# Patient Record
Sex: Male | Born: 1980 | Race: White | Hispanic: No | Marital: Single | Smoking: Current every day smoker
Health system: Southern US, Community
[De-identification: ages and names within clinical notes are randomized; demographics above are authoritative.]

## PROBLEM LIST (undated history)

## (undated) DIAGNOSIS — I10 Essential (primary) hypertension: Secondary | ICD-10-CM

## (undated) DIAGNOSIS — G459 Transient cerebral ischemic attack, unspecified: Secondary | ICD-10-CM

## (undated) DIAGNOSIS — I639 Cerebral infarction, unspecified: Secondary | ICD-10-CM

## (undated) HISTORY — PX: CORONARY ANGIOPLASTY WITH STENT PLACEMENT: SHX49

---

## 2016-05-06 ENCOUNTER — Encounter (HOSPITAL_COMMUNITY): Payer: Self-pay | Admitting: Emergency Medicine

## 2016-05-06 ENCOUNTER — Emergency Department (HOSPITAL_COMMUNITY): Payer: Medicare PPO

## 2016-05-06 ENCOUNTER — Emergency Department (HOSPITAL_COMMUNITY)
Admission: EM | Admit: 2016-05-06 | Discharge: 2016-05-06 | Disposition: A | Discharge status: Home / Self Care | Payer: Medicare PPO | Attending: Emergency Medicine | Admitting: Emergency Medicine

## 2016-05-06 DIAGNOSIS — M545 Low back pain, unspecified: Secondary | ICD-10-CM

## 2016-05-06 DIAGNOSIS — Z8673 Personal history of transient ischemic attack (TIA), and cerebral infarction without residual deficits: Secondary | ICD-10-CM | POA: Diagnosis not present

## 2016-05-06 DIAGNOSIS — I10 Essential (primary) hypertension: Secondary | ICD-10-CM | POA: Diagnosis not present

## 2016-05-06 DIAGNOSIS — R42 Dizziness and giddiness: Secondary | ICD-10-CM | POA: Diagnosis present

## 2016-05-06 DIAGNOSIS — R109 Unspecified abdominal pain: Secondary | ICD-10-CM | POA: Insufficient documentation

## 2016-05-06 DIAGNOSIS — Z7982 Long term (current) use of aspirin: Secondary | ICD-10-CM | POA: Diagnosis not present

## 2016-05-06 DIAGNOSIS — F17210 Nicotine dependence, cigarettes, uncomplicated: Secondary | ICD-10-CM | POA: Diagnosis not present

## 2016-05-06 HISTORY — DX: Cerebral infarction, unspecified: I63.9

## 2016-05-06 HISTORY — DX: Transient cerebral ischemic attack, unspecified: G45.9

## 2016-05-06 HISTORY — DX: Essential (primary) hypertension: I10

## 2016-05-06 LAB — DIFFERENTIAL
BASOS PCT: 0 %
Basophils Absolute: 0 10*3/uL (ref 0.0–0.1)
EOS PCT: 1 %
Eosinophils Absolute: 0.1 10*3/uL (ref 0.0–0.7)
LYMPHS PCT: 15 %
Lymphs Abs: 1.4 10*3/uL (ref 0.7–4.0)
MONO ABS: 0.4 10*3/uL (ref 0.1–1.0)
MONOS PCT: 5 %
NEUTROS ABS: 7.1 10*3/uL (ref 1.7–7.7)
Neutrophils Relative %: 79 %

## 2016-05-06 LAB — CBC
HEMATOCRIT: 44.5 % (ref 39.0–52.0)
Hemoglobin: 15.3 g/dL (ref 13.0–17.0)
MCH: 29.7 pg (ref 26.0–34.0)
MCHC: 34.4 g/dL (ref 30.0–36.0)
MCV: 86.4 fL (ref 78.0–100.0)
Platelets: 257 10*3/uL (ref 150–400)
RBC: 5.15 MIL/uL (ref 4.22–5.81)
RDW: 12.8 % (ref 11.5–15.5)
WBC: 9 10*3/uL (ref 4.0–10.5)

## 2016-05-06 LAB — URINALYSIS, ROUTINE W REFLEX MICROSCOPIC
BILIRUBIN URINE: NEGATIVE
GLUCOSE, UA: NEGATIVE mg/dL
HGB URINE DIPSTICK: NEGATIVE
Ketones, ur: NEGATIVE mg/dL
Leukocytes, UA: NEGATIVE
Nitrite: NEGATIVE
PROTEIN: NEGATIVE mg/dL
Specific Gravity, Urine: 1.004 — ABNORMAL LOW (ref 1.005–1.030)
pH: 7 (ref 5.0–8.0)

## 2016-05-06 LAB — COMPREHENSIVE METABOLIC PANEL
ALK PHOS: 94 U/L (ref 38–126)
ALT: 20 U/L (ref 17–63)
AST: 31 U/L (ref 15–41)
Albumin: 4.2 g/dL (ref 3.5–5.0)
Anion gap: 9 (ref 5–15)
BUN: 5 mg/dL — ABNORMAL LOW (ref 6–20)
CALCIUM: 9.1 mg/dL (ref 8.9–10.3)
CO2: 23 mmol/L (ref 22–32)
CREATININE: 0.84 mg/dL (ref 0.61–1.24)
Chloride: 107 mmol/L (ref 101–111)
Glucose, Bld: 97 mg/dL (ref 65–99)
Potassium: 4.5 mmol/L (ref 3.5–5.1)
Sodium: 139 mmol/L (ref 135–145)
Total Bilirubin: 1.4 mg/dL — ABNORMAL HIGH (ref 0.3–1.2)
Total Protein: 6.9 g/dL (ref 6.5–8.1)

## 2016-05-06 LAB — I-STAT CHEM 8, ED
BUN: 5 mg/dL — ABNORMAL LOW (ref 6–20)
CALCIUM ION: 1.04 mmol/L — AB (ref 1.15–1.40)
Chloride: 106 mmol/L (ref 101–111)
Creatinine, Ser: 0.8 mg/dL (ref 0.61–1.24)
GLUCOSE: 95 mg/dL (ref 65–99)
HCT: 41 % (ref 39.0–52.0)
HEMOGLOBIN: 13.9 g/dL (ref 13.0–17.0)
Potassium: 4.4 mmol/L (ref 3.5–5.1)
Sodium: 137 mmol/L (ref 135–145)
TCO2: 27 mmol/L (ref 0–100)

## 2016-05-06 LAB — APTT: aPTT: 30 seconds (ref 24–36)

## 2016-05-06 LAB — I-STAT TROPONIN, ED: TROPONIN I, POC: 0.01 ng/mL (ref 0.00–0.08)

## 2016-05-06 LAB — PROTIME-INR
INR: 0.98
Prothrombin Time: 13 seconds (ref 11.4–15.2)

## 2016-05-06 MED ORDER — KETOROLAC TROMETHAMINE 30 MG/ML IJ SOLN
30.0000 mg | Freq: Once | INTRAMUSCULAR | Status: DC
  Filled 2016-05-06: qty 1

## 2016-05-06 MED ORDER — ACETAMINOPHEN 325 MG PO TABS
650.0000 mg | ORAL_TABLET | Freq: Once | ORAL | Status: AC
  Administered 2016-05-06: 650 mg via ORAL
  Filled 2016-05-06: qty 2

## 2016-05-06 NOTE — ED Notes (Signed)
Pt verbalized understanding of d/c instructions and has no further questions. Pt stable and NAD.  

## 2016-05-06 NOTE — ED Notes (Signed)
Both techs in triage, unable to obtain blood on pt. Triage nurse notified.

## 2016-05-06 NOTE — Discharge Instructions (Signed)
Please taper off of your antianxiety medicine as this could likely be causing her dizziness.  If you have any new or worsening signs or symptoms please return to the emergency room for repeat evaluation.  Please follow-up with neurology if you continue to have dizziness despite medication stoppage.  Please read information related to back pain if he have any concerning signs or symptoms please return.  Please use Tylenol or ibuprofen as needed for discomfort

## 2016-05-06 NOTE — ED Triage Notes (Signed)
Pt states he has eben feeling dizzy since yesterday. Neuro is intact in triage. Pt states he has felt Sob at times as well. Pt has had 4 TIAs and a major stroke in 2005. Pt states he has had some palpitations as well. Also pt reports having right flank pain that goes around back. Painful urination. And unable to fully empty at times.

## 2016-05-06 NOTE — ED Provider Notes (Signed)
[facility] Provider Note   CSN: [id number] Arrival date & time: 05/06/16  1041   History   Chief Complaint Chief Complaint  Patient presents with  . Dizziness  . Flank Pain    HPI Luke Bowman is a 36 y.o. male.  HPI   36 year old male presents today with numerous complaints.  [place]  He notes a significant past medical history of stroke and TIA.  Patient notes that for an extended period of time he has had right back and flank pain.  He reports this radiates around to the right upper quadrant.  He notes his symptoms are generally worse in the morning.. Patient notes occasional difficulty with urination.   Patient also notes that he has had dizziness recently.  He notes for the last several months after starting Xanax he has had dizziness, reports this is worse with laying back and certain movements.  Patient reports that he is not taking 0.25 mg 3 times a day as needed for anxiety.  He denies any associated neurological deficits, reports that he does not feel anything like previous TIA or strokes.   Patient denies any fever chills nausea vomiting.    Past Medical History:  Diagnosis Date  . Hypertension   . Stroke (HCC)   . TIA (transient ischemic attack)     There are no active problems to display for this patient.   Past Surgical History:  Procedure Laterality Date  . CORONARY ANGIOPLASTY WITH STENT PLACEMENT         Home Medications    Prior to Admission medications   Medication Sig Start Date End Date Taking? Authorizing Provider  ALPRAZolam (XANAX) 0.25 MG tablet Take 0.25 mg by mouth every 8 (eight) hours. 03/21/16  Yes Historical Provider, MD  aspirin EC 81 MG tablet Take 81 mg by mouth daily. AND TAKES AN ADDITIONAL 81 MG/DAY IF NEEDED FOR HEADACHES   Yes Historical Provider, MD    Family History History reviewed. No pertinent family history.  Social History Social History  Substance Use Topics  .  Smoking status: Current Every Day Smoker    Types: Cigarettes  . Smokeless tobacco: Never Used  . Alcohol use No     Allergies   Toradol [ketorolac tromethamine]; Dilaudid [hydromorphone hcl]; and Metoprolol   Review of Systems Review of Systems  All other systems reviewed and are negative.   Physical Exam Updated Vital Signs BP 128/78   Pulse 67   Temp 98 F (36.7 C) (Oral)   Resp 16   Ht 5\' 11"  (1.803 m)   Wt 83 kg   SpO2 99%   BMI 25.52 kg/m   Physical Exam  Constitutional: He is oriented to person, place, and time. He appears well-developed and well-nourished.  HENT:  Head: Normocephalic and atraumatic.  Eyes: Conjunctivae are normal. Pupils are equal, round, and reactive to light. Right eye exhibits no discharge. Left eye exhibits no discharge. No scleral icterus.  Neck: Normal range of motion. No JVD present. No tracheal deviation present.  Pulmonary/Chest: Effort normal. No stridor.  Abdominal: Soft. He exhibits no distension and no mass. There is no tenderness. There is no rebound and no guarding. No hernia.  Musculoskeletal:  Tenderness to palpation of the right lower lumbar soft tissue and flank, no signs of trauma or swelling or edema  Neurological: He is alert and oriented to person, place, and time. He has normal strength. No cranial nerve deficit or sensory deficit. Coordination normal. GCS  eye subscore is 4. GCS verbal subscore is 5. GCS motor subscore is 6.  Psychiatric: He has a normal mood and affect. His behavior is normal. Judgment and thought content normal.  Nursing note and vitals reviewed.    ED Treatments / Results  Labs (all labs ordered are listed, but only abnormal results are displayed) Labs Reviewed  URINALYSIS, ROUTINE W REFLEX MICROSCOPIC - Abnormal; Notable for the following:       Result Value   Color, Urine STRAW (*)    Specific Gravity, Urine 1.004 (*)    All other components within normal limits  COMPREHENSIVE METABOLIC PANEL  - Abnormal; Notable for the following:    BUN 5 (*)    Total Bilirubin 1.4 (*)    All other components within normal limits  I-STAT CHEM 8, ED - Abnormal; Notable for the following:    BUN 5 (*)    Calcium, Ion 1.04 (*)    All other components within normal limits  PROTIME-INR  APTT  CBC  DIFFERENTIAL  I-STAT TROPOININ, ED  CBG MONITORING, ED    EKG  EKG Interpretation  Date/Time:  Friday May 06 2016 11:02:52 EST Ventricular Rate:  100 PR Interval:  124 QRS Duration: 94 QT Interval:  344 QTC Calculation: 443 R Axis:   76 Text Interpretation:  Normal sinus rhythm Normal ECG Confirmed by Luke Busman  MD, ANTHONY ([id number]) on 05/06/2016 3:28:55 PM       Radiology Ct Head Wo Contrast  Result Date: 05/06/2016 CLINICAL DATA:  Headache. EXAM: CT HEAD WITHOUT CONTRAST TECHNIQUE: Contiguous axial images were obtained from the base of the skull through the vertex without intravenous contrast. COMPARISON:  None. FINDINGS: Brain: No evidence of acute infarction, hemorrhage, hydrocephalus, extra-axial collection or mass lesion/mass effect. Vascular: No hyperdense vessel or unexpected calcification. Skull: Normal. Negative for fracture or focal lesion. Sinuses/Orbits: No acute finding. Other: None. IMPRESSION: Normal head CT. Electronically Signed   By: Lupita Raider, M.D.   On: 05/06/2016 11:42   Ct Renal Stone Study  Result Date: 05/06/2016 CLINICAL DATA:  Right flank pain.  Pain with urination. EXAM: CT ABDOMEN AND PELVIS WITHOUT CONTRAST TECHNIQUE: Multidetector CT imaging of the abdomen and pelvis was performed following the standard protocol without IV contrast. COMPARISON:  None. FINDINGS: Lower chest: Mild dependent atelectasis is present at the lung bases bilaterally. No focal nodule, mass, or airspace disease is present. The heart size is normal. No significant pleural or pericardial effusion is present. Hepatobiliary: No focal hepatic lesions are present. Liver contour is within normal  limits. The common bile duct is within normal limits following cholecystectomy. Pancreas: Unremarkable. No pancreatic ductal dilatation or surrounding inflammatory changes. Spleen: Normal in size without focal abnormality. Adrenals/Urinary Tract: Adrenal glands are unremarkable. Kidneys are normal, without renal calculi, focal lesion, or hydronephrosis. Bladder is unremarkable. Stomach/Bowel: Stomach is within normal limits. Appendix appears normal. No evidence of bowel wall thickening, distention, or inflammatory changes. Vascular/Lymphatic: No significant vascular findings are present. No enlarged abdominal or pelvic lymph nodes. Reproductive: Prostate is unremarkable. Other: No abdominal wall hernia or abnormality. No abdominopelvic ascites. Musculoskeletal: Vertebral body heights and alignment are normal. No focal lytic or blastic lesions are present. Bony pelvis is intact. The hips are unremarkable. IMPRESSION: 1. No acute or focal lesion to explain the patient's right-sided flank and back pain. 2. Negative CT of the abdomen without contrast the Electronically Signed   By: Marin Roberts M.D.   On: 05/06/2016 20:05    Procedures  Procedures (including critical care time)  Medications Ordered in ED Medications  acetaminophen (TYLENOL) tablet 650 mg (650 mg Oral Given 05/06/16 1900)     Initial Impression / Assessment and Plan / ED Course  I have reviewed the triage vital signs and the nursing notes.  Pertinent labs & imaging results that were available during my care of the patient were reviewed by me and considered in my medical decision making (see chart for details).     Final Clinical Impressions(s) / ED Diagnoses   Final diagnoses:  Acute right-sided low back pain without sciatica  Dizziness    Labs: Urinalysis, i-STAT Chem-8, serum troponin, PT/INR, APTT  Imaging:  Consults:  Therapeutics:  Discharge Meds:   Assessment/Plan: 36 year old male presents today with vague  complaints.  Patient having back pain, concern for kidney stones.  CT shows no significant findings.  Patient is very well appearing.  Likely back pain.  Patient also complaining of dizziness since starting new antianxiety medicine, unlikely to be a central cause including stroke.  Less likely but possible to be peripheral vertigo, no significant symptoms here.  CT head was ordered prior to my evaluation.  Patient will be instructed to slowly titrate off medication, follow up with neurology if continues to have episodic dizziness.  Patient is given strict return precautions, he verbalized understanding and agreement to today's plan had no further questions or concerns.   New Prescriptions Discharge Medication List as of 05/06/2016  8:48 PM       Eyvonne Mechanic, PA-C 05/06/16 2125    Lorre Nick, MD 05/07/16 0730

## 2016-05-06 NOTE — ED Notes (Signed)
Patient transported to CT 

## 2016-05-12 ENCOUNTER — Ambulatory Visit: Payer: Self-pay | Admitting: Family Medicine

## 2016-05-23 ENCOUNTER — Emergency Department (HOSPITAL_COMMUNITY)
Admission: EM | Admit: 2016-05-23 | Discharge: 2016-05-24 | Disposition: A | Discharge status: Home / Self Care | Payer: Medicare PPO | Attending: Emergency Medicine | Admitting: Emergency Medicine

## 2016-05-23 ENCOUNTER — Encounter (HOSPITAL_COMMUNITY): Payer: Self-pay | Admitting: Emergency Medicine

## 2016-05-23 ENCOUNTER — Emergency Department (HOSPITAL_COMMUNITY): Payer: Medicare PPO

## 2016-05-23 DIAGNOSIS — Z955 Presence of coronary angioplasty implant and graft: Secondary | ICD-10-CM | POA: Insufficient documentation

## 2016-05-23 DIAGNOSIS — I10 Essential (primary) hypertension: Secondary | ICD-10-CM | POA: Diagnosis not present

## 2016-05-23 DIAGNOSIS — Z8673 Personal history of transient ischemic attack (TIA), and cerebral infarction without residual deficits: Secondary | ICD-10-CM | POA: Diagnosis not present

## 2016-05-23 DIAGNOSIS — R1032 Left lower quadrant pain: Secondary | ICD-10-CM | POA: Insufficient documentation

## 2016-05-23 DIAGNOSIS — Z79899 Other long term (current) drug therapy: Secondary | ICD-10-CM | POA: Diagnosis not present

## 2016-05-23 DIAGNOSIS — Z7982 Long term (current) use of aspirin: Secondary | ICD-10-CM | POA: Insufficient documentation

## 2016-05-23 DIAGNOSIS — R109 Unspecified abdominal pain: Secondary | ICD-10-CM | POA: Diagnosis present

## 2016-05-23 DIAGNOSIS — F17210 Nicotine dependence, cigarettes, uncomplicated: Secondary | ICD-10-CM | POA: Insufficient documentation

## 2016-05-23 LAB — LIPASE, BLOOD: Lipase: 24 U/L (ref 11–51)

## 2016-05-23 LAB — CBC
HCT: 44.4 % (ref 39.0–52.0)
Hemoglobin: 15.1 g/dL (ref 13.0–17.0)
MCH: 29.3 pg (ref 26.0–34.0)
MCHC: 34 g/dL (ref 30.0–36.0)
MCV: 86.2 fL (ref 78.0–100.0)
PLATELETS: 260 10*3/uL (ref 150–400)
RBC: 5.15 MIL/uL (ref 4.22–5.81)
RDW: 12.7 % (ref 11.5–15.5)
WBC: 8.7 10*3/uL (ref 4.0–10.5)

## 2016-05-23 LAB — URINALYSIS, ROUTINE W REFLEX MICROSCOPIC
Bacteria, UA: NONE SEEN
Bilirubin Urine: NEGATIVE
GLUCOSE, UA: NEGATIVE mg/dL
Hgb urine dipstick: NEGATIVE
KETONES UR: NEGATIVE mg/dL
Nitrite: NEGATIVE
PROTEIN: NEGATIVE mg/dL
Specific Gravity, Urine: 1.004 — ABNORMAL LOW (ref 1.005–1.030)
Squamous Epithelial / LPF: NONE SEEN
WBC, UA: NONE SEEN WBC/hpf (ref 0–5)
pH: 7 (ref 5.0–8.0)

## 2016-05-23 LAB — COMPREHENSIVE METABOLIC PANEL
ALK PHOS: 96 U/L (ref 38–126)
ALT: 36 U/L (ref 17–63)
AST: 24 U/L (ref 15–41)
Albumin: 4.5 g/dL (ref 3.5–5.0)
Anion gap: 8 (ref 5–15)
BUN: 12 mg/dL (ref 6–20)
CALCIUM: 9.1 mg/dL (ref 8.9–10.3)
CO2: 27 mmol/L (ref 22–32)
CREATININE: 0.91 mg/dL (ref 0.61–1.24)
Chloride: 104 mmol/L (ref 101–111)
Glucose, Bld: 100 mg/dL — ABNORMAL HIGH (ref 65–99)
Potassium: 3.6 mmol/L (ref 3.5–5.1)
Sodium: 139 mmol/L (ref 135–145)
TOTAL PROTEIN: 7.2 g/dL (ref 6.5–8.1)
Total Bilirubin: 0.3 mg/dL (ref 0.3–1.2)

## 2016-05-23 NOTE — ED Triage Notes (Signed)
Pt reports LLQ pain and diarrhea x1 hour, pt denies any urinary symptoms. Ambulatory into triage, nad.

## 2016-05-23 NOTE — ED Provider Notes (Signed)
[facility] Provider Note   CSN: [id number] Arrival date & time: 05/23/16  1747     History   Chief Complaint Chief Complaint  Patient presents with  . Abdominal Pain    HPI Luke Bowman is a 36 y.o. male.  He complains of a hot feeling in the right side of his abdomen today. He rates the pain at 8/10. There is associated diarrhea. He denies nausea or vomiting. He denies fever or chills. Nothing makes it better nothing makes it worse. He states that at one point, he did break out in a rash which resolved spontaneously. He has also been having a hot feeling in his back intermittently over the last 2-3 weeks. He relates all of this to his PCP changing his medication from Ativan to Xanax. He is status post cholecystectomy.   The history is provided by the patient.  Abdominal Pain      Past Medical History:  Diagnosis Date  . Hypertension   . Stroke (HCC)   . TIA (transient ischemic attack)     There are no active problems to display for this patient.   Past Surgical History:  Procedure Laterality Date  . CORONARY ANGIOPLASTY WITH STENT PLACEMENT         Home Medications    Prior to Admission medications   Medication Sig Start Date End Date Taking? Authorizing Provider  ALPRAZolam (XANAX) 0.25 MG tablet Take 0.25 mg by mouth every 8 (eight) hours. 03/21/16   Historical Provider, MD  aspirin EC 81 MG tablet Take 81 mg by mouth daily. AND TAKES AN ADDITIONAL 81 MG/DAY IF NEEDED FOR HEADACHES    Historical Provider, MD    Family History No family history on file.  Social History Social History  Substance Use Topics  . Smoking status: Current Every Day Smoker    Types: Cigarettes  . Smokeless tobacco: Never Used  . Alcohol use No     Allergies   Toradol [ketorolac tromethamine]; Dilaudid [hydromorphone hcl]; and Metoprolol   Review of Systems Review of Systems  Gastrointestinal: Positive for abdominal pain.  All other systems reviewed and are  negative.    Physical Exam Updated Vital Signs BP (!) 142/95 (BP Location: Right Arm)   Pulse 100   Temp 97.7 F (36.5 C) (Oral)   Resp 16   SpO2 99%   Physical Exam  Nursing note and vitals reviewed.  36 year old male, resting comfortably and in no acute distress. Vital signs are significant for hypertension. Oxygen saturation is 99%, which is normal. Head is normocephalic and atraumatic. PERRLA, EOMI. Oropharynx is clear. Neck is nontender and supple without adenopathy or JVD. Back is nontender and there is no CVA tenderness. Lungs are clear without rales, wheezes, or rhonchi. Chest is nontender. Heart has regular rate and rhythm without murmur. Abdomen is soft, flat, with moderate right-sided abdominal tenderness. Maximum tenderness is in the right upper quadrant. There is no rebound or guarding. There are no masses or hepatosplenomegaly and peristalsis is normoactive. Extremities have no cyanosis or edema, full range of motion is present. Skin is warm and dry without rash. Neurologic: Mental status is normal, cranial nerves are intact, there are no motor or sensory deficits.  ED Treatments / Results  Labs (all labs ordered are listed, but only abnormal results are displayed) Labs Reviewed  COMPREHENSIVE METABOLIC PANEL - Abnormal; Notable for the following:       Result Value   Glucose, Bld 100 (*)    All  other components within normal limits  URINALYSIS, ROUTINE W REFLEX MICROSCOPIC - Abnormal; Notable for the following:    Color, Urine STRAW (*)    Specific Gravity, Urine 1.004 (*)    Leukocytes, UA TRACE (*)    All other components within normal limits  LIPASE, BLOOD  CBC    Radiology [place] Abdomen Complete  Result Date: 05/23/2016 CLINICAL DATA:  Initial evaluation for acute right-sided abdominal pain. EXAM: ABDOMEN ULTRASOUND COMPLETE COMPARISON:  Prior CT from 05/06/2016. FINDINGS: Gallbladder: Surgically absent. Common bile duct: Diameter: 4 mm Liver: No focal  lesion identified. Within normal limits in parenchymal echogenicity. IVC: No abnormality visualized. Pancreas: Visualized portion unremarkable. Spleen: Size and appearance within normal limits. Right Kidney: Length: 12.1 cm. Echogenicity within normal limits. No solid mass or hydronephrosis visualized. 0.9 x 1.2 x 1.1 cm cyst noted. Left Kidney: Length: 11.5 cm. Echogenicity within normal limits. No mass or hydronephrosis visualized. Abdominal aorta: No aneurysm visualized. Other findings: None. IMPRESSION: 1. Negative abdominal ultrasound.  No acute abnormality identified. 2. Status post cholecystectomy. 3. 1.1 cm right renal cyst. Electronically Signed   By: Rise MuBenjamin  McClintock M.D.   On: 05/23/2016 23:51    Procedures Procedures (including critical care time)  Medications Ordered in ED Medications  oxyCODONE-acetaminophen (PERCOCET/ROXICET) 5-325 MG per tablet 1 tablet (not administered)  LORazepam (ATIVAN) tablet 0.5 mg (not administered)     Initial Impression / Assessment and Plan / ED Course  I have reviewed the triage vital signs and the nursing notes.  Pertinent labs & imaging results that were available during my care of the patient were reviewed by me and considered in my medical decision making (see chart for details).  Abdominal pain of uncertain cause. His description is rather peculiar. Laboratory workup is unremarkable. Review of old records shows a negative CT of his abdomen and pelvis done on March 2. We'll check ultrasound to make sure there is no evidence of choledocholithiasis, although, that would be unlikely in face of normal liver function tests. If workup is negative, will try switching him back to lorazepam from alprazolam to see if that does help his symptoms.  Ultrasound is unremarkable. He is given a prescription for 10 day supply of lorazepam and is referred back to [facility]. Return precautions  discussed.  Final Clinical Impressions(s) / ED Diagnoses   Final diagnoses:  Right sided abdominal pain    New Prescriptions New Prescriptions   LORAZEPAM (ATIVAN) 0.5 MG TABLET    Take 1 tablet (0.5 mg total) by mouth 3 (three) times daily as needed for anxiety.     Dione Boozeavid Lis Savitt, MD 05/24/16 [phone]

## 2016-05-24 DIAGNOSIS — R1032 Left lower quadrant pain: Secondary | ICD-10-CM | POA: Diagnosis not present

## 2016-05-24 MED ORDER — LORAZEPAM 0.5 MG PO TABS: 0.5000 mg | ORAL_TABLET | Freq: Three times a day (TID) | ORAL | 0 refills | Status: DC | PRN

## 2016-05-24 MED ORDER — ACETAMINOPHEN 325 MG PO TABS
650.0000 mg | ORAL_TABLET | Freq: Once | ORAL | Status: AC
  Administered 2016-05-24: 650 mg via ORAL
  Filled 2016-05-24: qty 2

## 2016-05-24 MED ORDER — OXYCODONE-ACETAMINOPHEN 5-325 MG PO TABS
1.0000 | ORAL_TABLET | Freq: Once | ORAL | Status: DC
  Filled 2016-05-24: qty 1

## 2016-05-24 MED ORDER — LORAZEPAM 0.5 MG PO TABS
0.5000 mg | ORAL_TABLET | Freq: Once | ORAL | Status: AC
Start: 2016-05-24 — End: 2016-05-24
  Administered 2016-05-24: 0.5 mg via ORAL
  Filled 2016-05-24: qty 1

## 2016-05-24 NOTE — Discharge Instructions (Signed)
Stop taking alprazolam (Xanax). Start taking lorazepam (Ativan). Return to the ED if your pain is getting worse.

## 2016-05-27 ENCOUNTER — Encounter (HOSPITAL_COMMUNITY): Payer: Self-pay | Admitting: *Deleted

## 2016-05-27 ENCOUNTER — Emergency Department (HOSPITAL_COMMUNITY): Payer: Medicare PPO

## 2016-05-27 ENCOUNTER — Emergency Department (HOSPITAL_COMMUNITY)
Admission: EM | Admit: 2016-05-27 | Discharge: 2016-05-28 | Disposition: A | Discharge status: Home / Self Care | Payer: Medicare PPO | Attending: Emergency Medicine | Admitting: Emergency Medicine

## 2016-05-27 DIAGNOSIS — R51 Headache: Secondary | ICD-10-CM | POA: Diagnosis present

## 2016-05-27 DIAGNOSIS — G44209 Tension-type headache, unspecified, not intractable: Secondary | ICD-10-CM | POA: Diagnosis not present

## 2016-05-27 DIAGNOSIS — R1011 Right upper quadrant pain: Secondary | ICD-10-CM | POA: Diagnosis not present

## 2016-05-27 DIAGNOSIS — Z8673 Personal history of transient ischemic attack (TIA), and cerebral infarction without residual deficits: Secondary | ICD-10-CM | POA: Diagnosis not present

## 2016-05-27 DIAGNOSIS — F17210 Nicotine dependence, cigarettes, uncomplicated: Secondary | ICD-10-CM | POA: Diagnosis not present

## 2016-05-27 DIAGNOSIS — Z7982 Long term (current) use of aspirin: Secondary | ICD-10-CM | POA: Insufficient documentation

## 2016-05-27 DIAGNOSIS — I10 Essential (primary) hypertension: Secondary | ICD-10-CM | POA: Diagnosis not present

## 2016-05-27 DIAGNOSIS — Z955 Presence of coronary angioplasty implant and graft: Secondary | ICD-10-CM | POA: Insufficient documentation

## 2016-05-27 LAB — CBC
HEMATOCRIT: 44.1 % (ref 39.0–52.0)
Hemoglobin: 15.1 g/dL (ref 13.0–17.0)
MCH: 29.5 pg (ref 26.0–34.0)
MCHC: 34.2 g/dL (ref 30.0–36.0)
MCV: 86.1 fL (ref 78.0–100.0)
Platelets: 265 10*3/uL (ref 150–400)
RBC: 5.12 MIL/uL (ref 4.22–5.81)
RDW: 12.6 % (ref 11.5–15.5)
WBC: 7.6 10*3/uL (ref 4.0–10.5)

## 2016-05-27 LAB — BASIC METABOLIC PANEL
Anion gap: 9 (ref 5–15)
BUN: 7 mg/dL (ref 6–20)
CALCIUM: 9.4 mg/dL (ref 8.9–10.3)
CO2: 28 mmol/L (ref 22–32)
Chloride: 104 mmol/L (ref 101–111)
Creatinine, Ser: 0.85 mg/dL (ref 0.61–1.24)
GFR calc Af Amer: >60 mL/min (ref >60)
GLUCOSE: 88 mg/dL (ref 65–99)
Potassium: 4 mmol/L (ref 3.5–5.1)
Sodium: 141 mmol/L (ref 135–145)

## 2016-05-27 LAB — TROPONIN I: Troponin I: <0.03 ng/mL (ref <0.03)

## 2016-05-27 NOTE — ED Provider Notes (Signed)
[facility] Provider Note   CSN: [id number] Arrival date & time: 05/27/16  1952   By signing my name below, I, Octavia Heirrianna Nassar, attest that this documentation has been prepared under the direction and in the presence of Azalia BilisKevin Lenice Koper, MD.  Electronically Signed: Octavia HeirArianna Nassar, ED Scribe. 05/27/16. 11:38 PM.   History   Chief Complaint Chief Complaint  Patient presents with  . Headache   The history is provided by the patient. No language interpreter was used.   HPI Comments: Luke Bowman is a 36 y.o. male who has a PMhx of HTN and CVA presents to the Emergency Department complaining of a moderate, unchanged headache  that began ~ 4:30 this evening (~ 7 hours ago). He was sitting down watching television when he began to have acute onset left sided weakness, followed by a headache and numbness that radiated from the left side of his head down to his left lower extremity. He reports that his numbness is moderately resolved but still has mild residual numbness in his left foot. Pt also reports he has been having intermittent fever, chills, and diarrhea x 4 days. He states he has been around exposed to mold for quite some time and expresses his concern. Pt currently takes one ASA daily and is on Plavix. Pt further expresses intermittent, right sided abdominal pain x 4 days. Pt describes his abdominal pain as a "warm" sensation and expresses that he has a "metallic" taste in his mouth. He was seen in the ED on 03/19 for the same abdominal pain and had imaging including a CT Abd/Pel as well as lab work performed. All imaging and labs came back unremarkable. He was prescribed a 10 day supply of Lorazepam and was told to follow up with his PCP. Pt states he has been having intermittent abdominal pain since October 2017 after having a [place] in [place].  Positive fever, chills, diarrhea x 5 days ago. Pt denies vomiting. Past Medical History:  Diagnosis Date  . Hypertension    . Stroke (HCC)   . TIA (transient ischemic attack)     There are no active problems to display for this patient.   Past Surgical History:  Procedure Laterality Date  . CORONARY ANGIOPLASTY WITH STENT PLACEMENT         Home Medications    Prior to Admission medications   Medication Sig Start Date End Date Taking? Authorizing Provider  aspirin EC 81 MG tablet Take 81 mg by mouth daily. AND TAKES AN ADDITIONAL 81 MG/DAY IF NEEDED FOR HEADACHES    Historical Provider, MD  LORazepam (ATIVAN) 0.5 MG tablet Take 1 tablet (0.5 mg total) by mouth 3 (three) times daily as needed for anxiety. 05/24/16   Dione Boozeavid Glick, MD    Family History No family history on file.  Social History Social History  Substance Use Topics  . Smoking status: Current Every Day Smoker    Types: Cigarettes  . Smokeless tobacco: Never Used  . Alcohol use No     Allergies   Toradol [ketorolac tromethamine]; Dilaudid [hydromorphone hcl]; and Metoprolol   Review of Systems Review of Systems  A complete 10 system review of systems was obtained and all systems are negative except as noted in the HPI and PMH.   Physical Exam Updated Vital Signs BP 119/78 (BP Location: Right Arm)   Pulse 84   Temp 98 F (36.7 C) (Oral)   Resp 16   SpO2 100%   Physical Exam  Constitutional: He is  oriented to person, place, and time. He appears well-developed and well-nourished.  HENT:  Head: Normocephalic and atraumatic.  Eyes: EOM are normal. Pupils are equal, round, and reactive to light.  Neck: Normal range of motion.  Cardiovascular: Regular rhythm.   Pulmonary/Chest: Effort normal.  Abdominal: Soft. He exhibits no distension.  Musculoskeletal: Normal range of motion.  Neurological: He is alert and oriented to person, place, and time.  5/5 strength in major muscle groups of  bilateral upper and lower extremities. Speech normal. No facial asymetry.   Psychiatric: He has a normal mood and affect.  Nursing note  and vitals reviewed.    ED Treatments / Results  DIAGNOSTIC STUDIES: Oxygen Saturation is 100% on RA, normal by my interpretation.  COORDINATION OF CARE:  11:37 PM Discussed treatment plan with pt at bedside and pt agreed to plan.  Labs (all labs ordered are listed, but only abnormal results are displayed) Labs Reviewed  BASIC METABOLIC PANEL  CBC  TROPONIN I    EKG  EKG Interpretation None       Radiology Dg Chest 2 View  Result Date: 05/27/2016 CLINICAL DATA:  36 year old with headache and tingling in the arms and legs. Patient reports remote history of strokes. EXAM: CHEST  2 VIEW COMPARISON:  Abdominal CT 05/06/2016. FINDINGS: The heart size and mediastinal contours are normal. Increased density at the left cardiophrenic angle corresponds with epicardial fat on recent abdominal CT. The lungs are clear. There is no pleural effusion or pneumothorax. No acute osseous findings are seen. IMPRESSION: No active cardiopulmonary process. Electronically Signed   By: Carey Bullocks M.D.   On: 05/27/2016 21:07   Ct Head Wo Contrast  Result Date: 05/27/2016 CLINICAL DATA:  Acute onset of headache and left-sided weakness. Initial encounter. EXAM: CT HEAD WITHOUT CONTRAST TECHNIQUE: Contiguous axial images were obtained from the base of the skull through the vertex without intravenous contrast. COMPARISON:  CT of the head performed 05/06/2016 FINDINGS: Brain: No evidence of acute infarction, hemorrhage, hydrocephalus, extra-axial collection or mass lesion/mass effect. The posterior fossa, including the cerebellum, brainstem and fourth ventricle, is within normal limits. The third and lateral ventricles, and basal ganglia are unremarkable in appearance. The cerebral hemispheres are symmetric in appearance, with normal gray-white differentiation. No mass effect or midline shift is seen. Vascular: No hyperdense vessel or unexpected calcification. Skull: There is no evidence of fracture;  visualized osseous structures are unremarkable in appearance. Sinuses/Orbits: The orbits are within normal limits. The paranasal sinuses and mastoid air cells are well-aerated. Other: No significant soft tissue abnormalities are seen. IMPRESSION: Unremarkable noncontrast CT of the head. Electronically Signed   By: Roanna Raider M.D.   On: 05/27/2016 22:55    Procedures Procedures (including critical care time)  Medications Ordered in ED Medications - No data to display   Initial Impression / Assessment and Plan / ED Course  I have reviewed the triage vital signs and the nursing notes.  Pertinent labs & imaging results that were available during my care of the patient were reviewed by me and considered in my medical decision making (see chart for details).     Normal neuro exam. No tenderness. Paresthesias. Likely complicated migraine. Continues with ASA and plavix at this time for hx of stroke and TIA. Recurrent intermittent abdominal pain. No abdominal tenderness at this time. Likely IBS. Pt is fixated on the fact this is related to mold. Unclear if this is true or not. Outpatient follow up. Will need referral to GI  and pt requesting follow up with ID  Final Clinical Impressions(s) / ED Diagnoses   Final diagnoses:  Acute non intractable tension-type headache  Right upper quadrant abdominal pain   I personally performed the services described in this documentation, which was scribed in my presence. The recorded information has been reviewed and is accurate.     New Prescriptions New Prescriptions   No medications on file     Azalia Bilis, MD 05/28/16 0001

## 2016-05-27 NOTE — ED Notes (Signed)
Dr. Campos at bedside at this time.  

## 2016-05-27 NOTE — ED Triage Notes (Signed)
The pt is c/o a headache for 1-2 weeks and he is also c/o weakness in his lt arm and both legs since 1700 today  Speech is clear he was her last week for other cioncerns  And he has had some chest discomfort

## 2016-05-28 NOTE — Discharge Instructions (Signed)
May benefit from follow up with [facility]

## 2016-05-29 ENCOUNTER — Emergency Department (HOSPITAL_COMMUNITY): Payer: Medicare PPO

## 2016-05-29 ENCOUNTER — Encounter (HOSPITAL_COMMUNITY): Payer: Self-pay

## 2016-05-29 ENCOUNTER — Emergency Department (HOSPITAL_COMMUNITY)
Admission: EM | Admit: 2016-05-29 | Discharge: 2016-05-29 | Disposition: A | Discharge status: Home / Self Care | Payer: Medicare PPO | Attending: Emergency Medicine | Admitting: Emergency Medicine

## 2016-05-29 DIAGNOSIS — I2510 Atherosclerotic heart disease of native coronary artery without angina pectoris: Secondary | ICD-10-CM | POA: Diagnosis not present

## 2016-05-29 DIAGNOSIS — Z955 Presence of coronary angioplasty implant and graft: Secondary | ICD-10-CM | POA: Insufficient documentation

## 2016-05-29 DIAGNOSIS — Z7982 Long term (current) use of aspirin: Secondary | ICD-10-CM | POA: Insufficient documentation

## 2016-05-29 DIAGNOSIS — F17210 Nicotine dependence, cigarettes, uncomplicated: Secondary | ICD-10-CM | POA: Diagnosis not present

## 2016-05-29 DIAGNOSIS — I10 Essential (primary) hypertension: Secondary | ICD-10-CM | POA: Diagnosis not present

## 2016-05-29 DIAGNOSIS — R072 Precordial pain: Secondary | ICD-10-CM

## 2016-05-29 DIAGNOSIS — Z8673 Personal history of transient ischemic attack (TIA), and cerebral infarction without residual deficits: Secondary | ICD-10-CM | POA: Insufficient documentation

## 2016-05-29 DIAGNOSIS — R079 Chest pain, unspecified: Secondary | ICD-10-CM | POA: Diagnosis present

## 2016-05-29 LAB — BASIC METABOLIC PANEL
Anion gap: 12 (ref 5–15)
BUN: 8 mg/dL (ref 6–20)
CHLORIDE: 102 mmol/L (ref 101–111)
CO2: 26 mmol/L (ref 22–32)
Calcium: 8.9 mg/dL (ref 8.9–10.3)
Creatinine, Ser: 0.86 mg/dL (ref 0.61–1.24)
GFR calc non Af Amer: >60 mL/min (ref >60)
GLUCOSE: 107 mg/dL — AB (ref 65–99)
Potassium: 3.4 mmol/L — ABNORMAL LOW (ref 3.5–5.1)
Sodium: 140 mmol/L (ref 135–145)

## 2016-05-29 LAB — CBC
HEMATOCRIT: 43.8 % (ref 39.0–52.0)
HEMOGLOBIN: 15 g/dL (ref 13.0–17.0)
MCH: 29.6 pg (ref 26.0–34.0)
MCHC: 34.2 g/dL (ref 30.0–36.0)
MCV: 86.4 fL (ref 78.0–100.0)
Platelets: 253 10*3/uL (ref 150–400)
RBC: 5.07 MIL/uL (ref 4.22–5.81)
RDW: 12.5 % (ref 11.5–15.5)
WBC: 7.3 10*3/uL (ref 4.0–10.5)

## 2016-05-29 LAB — I-STAT TROPONIN, ED: Troponin i, poc: 0 ng/mL (ref 0.00–0.08)

## 2016-05-29 NOTE — ED Notes (Signed)
Pt and family understood dc material. NAD noted 

## 2016-05-29 NOTE — ED Provider Notes (Signed)
[facility]-EMERGENCY DEPT Provider Note   CSN: [id number] Arrival date & time: 05/29/16  2043     History   Chief Complaint Chief Complaint  Patient presents with  . Chest Pain  . Shortness of Breath    HPI Luke Bowman is a 36 y.o. male.  Luke Bowman is a 36 y.o. Male with history of TIA, and hypertension and CAD, who presents to the emergency department complaining of chest pain since 3 pm today and associated "hot feeling" to his bilateral neck up to the top of his head. He also reports dry eyes and mouth with a metal taste in his mouth. He denies neck pain or headache. He denies fevers or recent illness. He recent moved here from [place]. He would like me to check his TSH because he was living in a house with mold in [place]. He as follow up with his PCP this week. He reports a history of a cardiac stent in [place], but denies MI. He denies fevers, headache, changes to his vision, neck pain, neck stiffness, numbness, tingling, weakness, abdominal pain, nausea, vomiting, rashes, hemoptysis, leg pain or leg swelling.   The history is provided by the patient and medical records. No language interpreter was used.  Chest Pain   Associated symptoms include shortness of breath. Pertinent negatives include no abdominal pain, no back pain, no cough, no dizziness, no fever, no headaches, no nausea, no numbness, no palpitations, no vomiting and no weakness.  Shortness of Breath  Associated symptoms include chest pain. Pertinent negatives include no fever, no headaches, no sore throat, no neck pain, no cough, no wheezing, no vomiting, no abdominal pain, no rash and no leg swelling.    Past Medical History:  Diagnosis Date  . Hypertension   . Stroke (HCC)   . TIA (transient ischemic attack)     There are no active problems to display for this patient.   Past Surgical History:  Procedure Laterality Date  . CORONARY ANGIOPLASTY WITH STENT PLACEMENT         Home Medications     Prior to Admission medications   Medication Sig Start Date End Date Taking? Authorizing Provider  aspirin EC 81 MG tablet Take 81 mg by mouth daily. AND TAKES AN ADDITIONAL 81 MG/DAY IF NEEDED FOR HEADACHES   Yes Historical Provider, MD  LORazepam (ATIVAN) 0.5 MG tablet Take 1 tablet (0.5 mg total) by mouth 3 (three) times daily as needed for anxiety. 05/24/16  Yes Dione Booze, MD    Family History History reviewed. No pertinent family history.  Social History Social History  Substance Use Topics  . Smoking status: Current Every Day Smoker    Types: Cigarettes  . Smokeless tobacco: Never Used  . Alcohol use No     Allergies   Toradol [ketorolac tromethamine]; Dilaudid [hydromorphone hcl]; Ketorolac; Metoprolol; Hydromorphone; Propranolol; and Tramadol   Review of Systems Review of Systems  Constitutional: Negative for chills and fever.  HENT: Negative for congestion and sore throat.   Eyes: Negative for visual disturbance.  Respiratory: Positive for shortness of breath. Negative for cough and wheezing.   Cardiovascular: Positive for chest pain. Negative for palpitations and leg swelling.  Gastrointestinal: Negative for abdominal pain, diarrhea, nausea and vomiting.  Genitourinary: Negative for dysuria.  Musculoskeletal: Negative for back pain and neck pain.  Skin: Negative for rash.  Neurological: Negative for dizziness, syncope, weakness, numbness and headaches.     Physical Exam Updated Vital Signs BP 115/83   Pulse 81  Temp 97.7 F (36.5 C) (Oral)   Resp 12   SpO2 97%   Physical Exam  Constitutional: He is oriented to person, place, and time. He appears well-developed and well-nourished. No distress.  Nontoxic appearing.  HENT:  Head: Normocephalic and atraumatic.  Right Ear: External ear normal.  Left Ear: External ear normal.  Mouth/Throat: Oropharynx is clear and moist.  Eyes: Conjunctivae and EOM are normal. Pupils are equal, round, and reactive to  light. Right eye exhibits no discharge. Left eye exhibits no discharge.  Neck: Normal range of motion. Neck supple. No JVD present. No tracheal deviation present.  Cardiovascular: Normal rate, regular rhythm, normal heart sounds and intact distal pulses.  Exam reveals no gallop and no friction rub.   No murmur heard. Bilateral radial, posterior tibialis and dorsalis pedis pulses are intact.    Pulmonary/Chest: Effort normal and breath sounds normal. No stridor. No respiratory distress. He has no wheezes. He has no rales.  Lungs clear to auscultation bilaterally. No increased work of breathing. No rales or rhonchi.  Abdominal: Soft. There is no tenderness. There is no guarding.  Musculoskeletal: Normal range of motion. He exhibits no edema or tenderness.  No lower extremity edema or tenderness.  Lymphadenopathy:    He has no cervical adenopathy.  Neurological: He is alert and oriented to person, place, and time. No cranial nerve deficit or sensory deficit. He exhibits normal muscle tone. Coordination normal.  Patient is alert and oriented 3. Cranial nerves are intact. Speech is clear and coherent. No pronator drift. Sensation is intact his bilateral upper and lower extremities.  Skin: Skin is warm and dry. Capillary refill takes less than 2 seconds. No rash noted. He is not diaphoretic. No erythema. No pallor.  Psychiatric: He has a normal mood and affect. His behavior is normal.  Nursing note and vitals reviewed.    ED Treatments / Results  Labs (all labs ordered are listed, but only abnormal results are displayed) Labs Reviewed  BASIC METABOLIC PANEL - Abnormal; Notable for the following:       Result Value   Potassium 3.4 (*)    Glucose, Bld 107 (*)    All other components within normal limits  CBC  I-STAT TROPOININ, ED    EKG  EKG Interpretation  Date/Time:  Sunday May 29 2016 20:48:44 EDT Ventricular Rate:  94 PR Interval:  138 QRS Duration: 102 QT Interval:  364 QTC  Calculation: 455 R Axis:   76 Text Interpretation:  Normal sinus rhythm Normal ECG Confirmed by Adriana SimasOOK  MD, BRIAN ([id number]) on 05/29/2016 10:05:16 PM       Radiology Dg Chest 2 View  Result Date: 05/29/2016 CLINICAL DATA:  Chest pain, shortness of breath EXAM: CHEST  2 VIEW COMPARISON:  05/27/2016 FINDINGS: Cardiomediastinal silhouette is stable. No infiltrate or pleural effusion. No pulmonary edema. Bony thorax is unremarkable. IMPRESSION: No active cardiopulmonary disease. Electronically Signed   By: Natasha MeadLiviu  Pop M.D.   On: 05/29/2016 21:29    Procedures Procedures (including critical care time)  Medications Ordered in ED Medications - No data to display   Initial Impression / Assessment and Plan / ED Course  I have reviewed the triage vital signs and the nursing notes.  Pertinent labs & imaging results that were available during my care of the patient were reviewed by me and considered in my medical decision making (see chart for details).     This is a 36 y.o. Male with history of TIA, and hypertension  and CAD, who presents to the emergency department complaining of chest pain since 3 pm today and associated "hot feeling" to his bilateral neck up to the top of his head. He also reports dry eyes and mouth with a metal taste in his mouth. He denies neck pain or headache. He denies fevers or recent illness. He recent moved here from [place]. He would like me to check his TSH because he was living in a house with mold in [place]. He as follow up with his PCP this week. He reports a history of a cardiac stent in [place], but denies MI? He denies fevers, headache, changes to his vision, neck pain, neck stiffness, numbness, tingling.  On examination is afebrile and nontoxic appearing. He has no focal neurological deficits. His lungs are clear to auscultation bilaterally. He has no lower extremity edema and tenderness. He has no JVD or meningeal signs. No neck stiffness.  EKG is without acute  changes. Troponin is not elevated. BMP is unremarkable. CBC is within normal limits. Chest x-ray is unremarkable. Patient has multiple risk factors I advised that I would like to admit the patient for ACS rule out. Patient declines. I explained to him the importance and the reasoning behind doing repeat troponins. Patient reports he feels fine and does not want to be admitted to the hospital and will follow up with his primary care doctor tomorrow. I advised of the risk that he would take if this is the case and he reports he understands. He will follow up with primary care. I advised the patient to follow-up with their primary care provider this week. I advised the patient to return to the emergency department with new or worsening symptoms or new concerns. The patient verbalized understanding and agreement with plan.     Final Clinical Impressions(s) / ED Diagnoses   Final diagnoses:  Precordial pain    New Prescriptions New Prescriptions   No medications on file     Everlene Farrier, PA-C 05/29/16 2315    Donnetta Hutching, MD 05/31/16 1416

## 2016-05-29 NOTE — ED Triage Notes (Signed)
Pt complaining of central chest pain that radiates to neck. Pt states metallic taste in mouth. Pt also complaining of some cough and SOB. VSS at triage.

## 2016-06-04 ENCOUNTER — Emergency Department (HOSPITAL_COMMUNITY)
Admission: EM | Admit: 2016-06-04 | Discharge: 2016-06-04 | Disposition: A | Discharge status: Home / Self Care | Payer: Medicare PPO | Attending: Emergency Medicine | Admitting: Emergency Medicine

## 2016-06-04 ENCOUNTER — Encounter (HOSPITAL_COMMUNITY): Payer: Self-pay

## 2016-06-04 DIAGNOSIS — Z8673 Personal history of transient ischemic attack (TIA), and cerebral infarction without residual deficits: Secondary | ICD-10-CM | POA: Insufficient documentation

## 2016-06-04 DIAGNOSIS — Z955 Presence of coronary angioplasty implant and graft: Secondary | ICD-10-CM | POA: Insufficient documentation

## 2016-06-04 DIAGNOSIS — M545 Low back pain, unspecified: Secondary | ICD-10-CM

## 2016-06-04 DIAGNOSIS — F17210 Nicotine dependence, cigarettes, uncomplicated: Secondary | ICD-10-CM | POA: Insufficient documentation

## 2016-06-04 DIAGNOSIS — Z79899 Other long term (current) drug therapy: Secondary | ICD-10-CM | POA: Diagnosis not present

## 2016-06-04 DIAGNOSIS — Z7982 Long term (current) use of aspirin: Secondary | ICD-10-CM | POA: Insufficient documentation

## 2016-06-04 LAB — URINALYSIS, ROUTINE W REFLEX MICROSCOPIC
BILIRUBIN URINE: NEGATIVE
GLUCOSE, UA: NEGATIVE mg/dL
HGB URINE DIPSTICK: NEGATIVE
Ketones, ur: NEGATIVE mg/dL
Leukocytes, UA: NEGATIVE
Nitrite: NEGATIVE
PROTEIN: NEGATIVE mg/dL
Specific Gravity, Urine: 1.003 — ABNORMAL LOW (ref 1.005–1.030)
pH: 6 (ref 5.0–8.0)

## 2016-06-04 MED ORDER — ORPHENADRINE CITRATE ER 100 MG PO TB12: 100.0000 mg | ORAL_TABLET | Freq: Two times a day (BID) | ORAL | 0 refills | Status: DC

## 2016-06-04 NOTE — ED Triage Notes (Signed)
He c/o four-day hx of low back pain, with his bilat. Legs with mild paresthesias and feel "cold". He is in no distress.

## 2016-06-04 NOTE — ED Provider Notes (Signed)
[facility]-EMERGENCY DEPT Provider Note   CSN: [id number] Arrival date & time: 06/04/16  1740     History   Chief Complaint Chief Complaint  Patient presents with  . Cold Extremity  . Nasal Congestion  . Thrush    HPI Luke Bowman is a 36 y.o. male.  HPI Patient reports his lower back has been hurting for about 4 days now. He indicates the entirety of the low back from the low thoracic down to the iliac crests. He reports a sensation of heaviness also wraps around his abdomen. This then includes his legs which just feel kind of heavy to him. He has not had any urinary dysfunction. No pain no burning no retention of urine. No dysfunction of bowel movement. No fever no vomiting. No injury.  Patient and his wife did address their separate concern with me of exposure to black mold while they were in [place]. The patient reports that before that exposure he was completely fine and since then he's had many symptoms and medical problems. He reports however nobody can confirm that he has a black mold adverse reaction or infection. Patient does not have ongoing respiratory symptoms. He has had a constellation of other symptoms and sites the oral thrush that he has just recently taken medication for. He also reports he just feels chronically fatigued and worn down.  The chest pain for which he been seen last week is now resolved. He and his wife report that when they were in [place] they are had a lot of tests done but in [place] the doctors were not very good because they just tried to treat him with Ativan or medications that he didn't want to take. He states he has now gotten himself off of all medications except the medication just for the oral thrush. Past Medical History:  Diagnosis Date  . Hypertension   . Stroke (HCC)   . TIA (transient ischemic attack)     There are no active problems to display for this patient.   Past Surgical History:  Procedure Laterality Date  . CORONARY  ANGIOPLASTY WITH STENT PLACEMENT         Home Medications    Prior to Admission medications   Medication Sig Start Date End Date Taking? Authorizing Provider  aspirin EC 81 MG tablet Take 81 mg by mouth daily. AND TAKES AN ADDITIONAL 81 MG/DAY IF NEEDED FOR HEADACHES   Yes Historical Provider, MD  LORazepam (ATIVAN) 0.5 MG tablet Take 1 tablet (0.5 mg total) by mouth 3 (three) times daily as needed for anxiety. 05/24/16  Yes Dione Booze, MD  nystatin (MYCOSTATIN) 100000 UNIT/ML suspension Take 5 mLs by mouth 4 (four) times daily. 05/31/16  Yes Historical Provider, MD  orphenadrine (NORFLEX) 100 MG tablet Take 1 tablet (100 mg total) by mouth 2 (two) times daily. 06/04/16   Arby Barrette, MD    Family History No family history on file.  Social History Social History  Substance Use Topics  . Smoking status: Current Every Day Smoker    Types: Cigarettes  . Smokeless tobacco: Never Used  . Alcohol use No     Allergies   Toradol [ketorolac tromethamine]; Dilaudid [hydromorphone hcl]; Metoprolol; Hydromorphone; Propranolol; and Tramadol   Review of Systems Review of Systems 10 Systems reviewed and are negative for acute change except as noted in the HPI.  Physical Exam Updated Vital Signs BP (!) 120/100 (BP Location: Right Arm)   Pulse 90   Temp 97.9 F (36.6 C)  Resp 16   Ht  (1.803 m)   Wt 196 lb (88.9 kg)   SpO2 99%   BMI 27.34 kg/m   Physical Exam  Constitutional: He is oriented to person, place, and time. He appears well-developed and well-nourished.  Patient is well-developed. He is a healthy-appearing adult.  HENT:  Head: Normocephalic and atraumatic.  Right Ear: External ear normal.  Left Ear: External ear normal.  Nose: Nose normal.  Mouth/Throat: Oropharynx is clear and moist.  Patient exhibits his tongue for thrush but at this time it appears to be resolved. There is normal-appearing papillae on the tongue but no white plaque.  Eyes: Conjunctivae  and EOM are normal. Pupils are equal, round, and reactive to light.  Neck: Neck supple.  Cardiovascular: Normal rate, regular rhythm and intact distal pulses.   No murmur heard. Pulmonary/Chest: Effort normal and breath sounds normal. No respiratory distress. He has no wheezes.  Abdominal: Soft. Bowel sounds are normal. He exhibits no distension. There is no tenderness. There is no guarding.  Musculoskeletal: Normal range of motion. He exhibits no edema, tenderness or deformity.  Bilateral lower extremities have well-defined musculature. No peripheral edema. skin is normal with hair growth with no signs of vascular insufficiency. Distal pulses are 2+ and symmetric. Feet are both warm and dry.  Lymphadenopathy:    He has no cervical adenopathy.  Neurological: He is alert and oriented to person, place, and time. No cranial nerve deficit or sensory deficit. He exhibits normal muscle tone. Coordination normal.  Patient moves about the stretcher stands change his clothes all with normal function. No signs of pain restriction with activity nor neurologic dysfunction. Palpation of the back elicits pain response from the low thoracic to the low back bilaterally and throughout. There is no localization. No visible anomaly. no palpable anomaly.  Skin: Skin is warm and dry.  Psychiatric: He has a normal mood and affect.  Nursing note and vitals reviewed.    ED Treatments / Results  Labs (all labs ordered are listed, but only abnormal results are displayed) Labs Reviewed  URINALYSIS, ROUTINE W REFLEX MICROSCOPIC - Abnormal; Notable for the following:       Result Value   Color, Urine STRAW (*)    Specific Gravity, Urine 1.003 (*)    All other components within normal limits    EKG  EKG Interpretation None       Radiology No results found.  Procedures Procedures (including critical care time)  Medications Ordered in ED Medications - No data to display   Initial Impression /  Assessment and Plan / ED Course  I have reviewed the triage vital signs and the nursing notes.  Pertinent labs & imaging results that were available during my care of the patient were reviewed by me and considered in my medical decision making (see chart for details).      Final Clinical Impressions(s) / ED Diagnoses   Final diagnoses:  Acute bilateral low back pain without sciatica   At this time, no indication of neurologic etiology of the patient's back pain. His exam is normal. He has had diagnostic evaluation with lab test within the past several days that are normal. Currently I do not feel that further diagnostic imaging or blood work is needed. Patient persists in having significant concern for his exposure to black mold. He wished to see an infectious disease doctor. I did advise there is a infectious disease office that he could try to contact for outpatient appointment.  Patient is to continue working with his primary care provider regarding ongoing symptoms. As the patient's pain was very diffuse throughout the entirety of his low back, this is most consistent with musculoskeletal pain and patient is given Norflex for muscle spasm.  New Prescriptions New Prescriptions   ORPHENADRINE (NORFLEX) 100 MG TABLET    Take 1 tablet (100 mg total) by mouth 2 (two) times daily.     Arby Barrette, MD 06/04/16 2041

## 2016-06-10 ENCOUNTER — Emergency Department (HOSPITAL_COMMUNITY): Payer: Medicare PPO

## 2016-06-10 ENCOUNTER — Encounter (HOSPITAL_COMMUNITY): Payer: Self-pay | Admitting: *Deleted

## 2016-06-10 ENCOUNTER — Emergency Department (HOSPITAL_COMMUNITY)
Admission: EM | Admit: 2016-06-10 | Discharge: 2016-06-10 | Disposition: A | Discharge status: Home / Self Care | Payer: Medicare PPO | Attending: Emergency Medicine | Admitting: Emergency Medicine

## 2016-06-10 DIAGNOSIS — Z79899 Other long term (current) drug therapy: Secondary | ICD-10-CM | POA: Insufficient documentation

## 2016-06-10 DIAGNOSIS — N50811 Right testicular pain: Secondary | ICD-10-CM | POA: Insufficient documentation

## 2016-06-10 DIAGNOSIS — Z955 Presence of coronary angioplasty implant and graft: Secondary | ICD-10-CM | POA: Diagnosis not present

## 2016-06-10 DIAGNOSIS — M545 Low back pain, unspecified: Secondary | ICD-10-CM

## 2016-06-10 DIAGNOSIS — I10 Essential (primary) hypertension: Secondary | ICD-10-CM | POA: Insufficient documentation

## 2016-06-10 DIAGNOSIS — N50812 Left testicular pain: Secondary | ICD-10-CM | POA: Insufficient documentation

## 2016-06-10 DIAGNOSIS — N50819 Testicular pain, unspecified: Secondary | ICD-10-CM

## 2016-06-10 DIAGNOSIS — F17210 Nicotine dependence, cigarettes, uncomplicated: Secondary | ICD-10-CM | POA: Diagnosis not present

## 2016-06-10 DIAGNOSIS — Z8673 Personal history of transient ischemic attack (TIA), and cerebral infarction without residual deficits: Secondary | ICD-10-CM | POA: Diagnosis not present

## 2016-06-10 DIAGNOSIS — Z7982 Long term (current) use of aspirin: Secondary | ICD-10-CM | POA: Insufficient documentation

## 2016-06-10 LAB — URINALYSIS, ROUTINE W REFLEX MICROSCOPIC
BILIRUBIN URINE: NEGATIVE
Glucose, UA: NEGATIVE mg/dL
Hgb urine dipstick: NEGATIVE
KETONES UR: NEGATIVE mg/dL
Leukocytes, UA: NEGATIVE
Nitrite: NEGATIVE
PROTEIN: NEGATIVE mg/dL
Specific Gravity, Urine: 1.001 — ABNORMAL LOW (ref 1.005–1.030)
pH: 6 (ref 5.0–8.0)

## 2016-06-10 MED ORDER — CYCLOBENZAPRINE HCL 10 MG PO TABS: 10.0000 mg | ORAL_TABLET | Freq: Two times a day (BID) | ORAL | 0 refills | Status: AC | PRN

## 2016-06-10 NOTE — ED Notes (Signed)
US at bedside

## 2016-06-10 NOTE — ED Provider Notes (Signed)
[facility]-EMERGENCY DEPT Provider Note   CSN: [id number] Arrival date & time: 06/10/16  1544     History   Chief Complaint Chief Complaint  Patient presents with  . Shortness of Breath  . Headache  . Back Pain    HPI Luke Bowman is a 36 y.o. male.  Patient presents with multiple concerns.  He complains of low back pain., Bilateral testicular pain, dizziness, dysuria, dyspnea. No neurological deficits, fever, sweats, chills, chest pain. 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 Additionally there was a cyst on his right kidney noted. He is presently withdrawing from Ativan.      Past Medical History:  Diagnosis Date  . Hypertension   . Stroke (HCC)   . TIA (transient ischemic attack)     There are no active problems to display for this patient.   Past Surgical History:  Procedure Laterality Date  . CORONARY ANGIOPLASTY WITH STENT PLACEMENT         Home Medications    Prior to Admission medications   Medication Sig Start Date End Date Taking? Authorizing Provider  aspirin EC 81 MG tablet Take 81 mg by mouth daily. AND TAKES AN ADDITIONAL 81 MG/DAY IF NEEDED FOR HEADACHES   Yes Historical Provider, MD  ibuprofen (ADVIL,MOTRIN) 200 MG tablet Take 200 mg by mouth every 6 (six) hours as needed for moderate pain.   Yes Historical Provider, MD  cyclobenzaprine (FLEXERIL) 10 MG tablet Take 1 tablet (10 mg total) by mouth 2 (two) times daily as needed for muscle spasms. 06/10/16   Donnetta Hutching, MD  LORazepam (ATIVAN) 0.5 MG tablet Take 1 tablet (0.5 mg total) by mouth 3 (three) times daily as needed for anxiety. Patient not taking: Reported on 06/10/2016 05/24/16   Dione Booze, MD  orphenadrine (NORFLEX) 100 MG tablet Take 1 tablet (100 mg total) by mouth 2 (two) times daily. Patient not taking: Reported on 06/10/2016 06/04/16   Arby Barrette, MD    Family History No family history on file.  Social History Social History  Substance  Use Topics  . Smoking status: Current Every Day Smoker    Types: Cigarettes  . Smokeless tobacco: Never Used  . Alcohol use No     Allergies   Toradol [ketorolac tromethamine]; Dilaudid [hydromorphone hcl]; Metoprolol; Hydromorphone; Propranolol; and Tramadol   Review of Systems Review of Systems  All other systems reviewed and are negative.    Physical Exam Updated Vital Signs BP (!) 135/95 (BP Location: Left Arm)   Pulse 90   Temp 98.2 F (36.8 C) (Oral)   Resp 16   SpO2 97%   Physical Exam  Constitutional: He is oriented to person, place, and time. He appears well-developed and well-nourished.  Patient is alert and ambulatory without obvious distress  HENT:  Head: Normocephalic and atraumatic.  Eyes: Conjunctivae are normal.  Neck: Neck supple.  Cardiovascular: Normal rate and regular rhythm.   Pulmonary/Chest: Effort normal and breath sounds normal.  Abdominal: Soft. Bowel sounds are normal.  Genitourinary:  Genitourinary Comments: Penis, scrotum, testicles appear normal. Testicles are normal in size and appearance  Musculoskeletal: Normal range of motion.  Minimal tenderness to palpation in the lower lumbar spine.  Neurological: He is alert and oriented to person, place, and time.  Skin: Skin is warm and dry.  Psychiatric: He has a normal mood and affect. His behavior is normal.  Nursing note and vitals reviewed.    ED Treatments / Results  Labs (all labs ordered are listed, but only abnormal results are displayed) Labs Reviewed  URINALYSIS, ROUTINE W REFLEX MICROSCOPIC - Abnormal; Notable for the following:       Result Value   Color, Urine COLORLESS (*)    Specific Gravity, Urine 1.001 (*)    All other components within normal limits    EKG  EKG Interpretation None       Radiology Dg Chest 2 View  Result Date: 06/10/2016 CLINICAL DATA:  Increasing shortness of breath. EXAM: CHEST  2 VIEW COMPARISON:  05/29/2016 FINDINGS: The heart size and  mediastinal contours are within normal limits. Both lungs are clear. The visualized skeletal structures are unremarkable. IMPRESSION: No active cardiopulmonary disease. Electronically Signed   By: Francene Boyers M.D.   On: 06/10/2016 16:34   Dg Lumbar Spine Complete  Result Date: 06/10/2016 CLINICAL DATA:  Acute over chronic low back pain without injury. EXAM: LUMBAR SPINE - COMPLETE 4+ VIEW COMPARISON:  None. FINDINGS: Osseous alignment is normal. Bone mineralization is normal. No cortical irregularity or osseous lesion. No fracture line or displaced fracture fragment. No appreciable degenerative change. No pars interarticularis defect. Paravertebral soft tissues are unremarkable. Cholecystectomy clips noted in the right upper quadrant. IMPRESSION: Negative. Electronically Signed   By: Bary Richard M.D.   On: 06/10/2016 16:49   US Scrotum  Result Date: 06/10/2016 CLINICAL DATA:  Bilateral testicular pain for several weeks. EXAM: SCROTAL ULTRASOUND DOPPLER ULTRASOUND OF THE TESTICLES TECHNIQUE: Complete ultrasound examination of the testicles, epididymis, and other scrotal structures was performed. Color and spectral Doppler ultrasound were also utilized to evaluate blood flow to the testicles. COMPARISON:  None. FINDINGS: Right testicle Measurements: 4 x 1.7 x 2.8 cm. No mass or microlithiasis visualized. Left testicle Measurements: 4 x 1.8 x 2.3 cm. No mass or microlithiasis visualized. Right epididymis: Incidental note of a 3 mm epididymal cyst. Otherwise norm in size and appearance. Left epididymis: Incidental note of a 2 mm epididymal cyst. Otherwise normal in size and appearance. Hydrocele:  None visualized. Varicocele:  None visualized. Pulsed Doppler interrogation of both testes demonstrates normal low resistance arterial and venous waveforms bilaterally. IMPRESSION: Essentially normal scrotal ultrasound. No acute findings. No evidence of testicular torsion or orchitis. No evidence of epididymitis.  Incidental note of small bilateral epididymal cysts. Electronically Signed   By: Bary Richard M.D.   On: 06/10/2016 17:27   [place] Art/ven Flow Abd Pelv Doppler  Result Date: 06/10/2016 CLINICAL DATA:  Bilateral testicular pain for several weeks. EXAM: SCROTAL ULTRASOUND DOPPLER ULTRASOUND OF THE TESTICLES TECHNIQUE: Complete ultrasound examination of the testicles, epididymis, and other scrotal structures was performed. Color and spectral Doppler ultrasound were also utilized to evaluate blood flow to the testicles. COMPARISON:  None. FINDINGS: Right testicle Measurements: 4 x 1.7 x 2.8 cm. No mass or microlithiasis visualized. Left testicle Measurements: 4 x 1.8 x 2.3 cm. No mass or microlithiasis visualized. Right epididymis: Incidental note of a 3 mm epididymal cyst. Otherwise norm in size and appearance. Left epididymis: Incidental note of a 2 mm epididymal cyst. Otherwise normal in size and appearance. Hydrocele:  None visualized. Varicocele:  None visualized. Pulsed Doppler interrogation of both testes demonstrates normal low resistance arterial and venous waveforms bilaterally. IMPRESSION: Essentially normal scrotal ultrasound. No acute findings. No evidence of testicular torsion or orchitis. No evidence of epididymitis. Incidental note of small bilateral epididymal cysts. Electronically Signed   By: Bary Richard M.D.   On: 06/10/2016 17:27    Procedures Procedures (including critical  care time)  Medications Ordered in ED Medications - No data to display   Initial Impression / Assessment and Plan / ED Course  I have reviewed the triage vital signs and the nursing notes.  Pertinent labs & imaging results that were available during my care of the patient were reviewed by me and considered in my medical decision making (see chart for details).    Patient is in no acute distress. Testicular ultrasound is negative. Lumbar spine films negative.  He has a primary care relationship at [facility].  Discharge medication Flexeril 10 mg.  Final Clinical Impressions(s) / ED Diagnoses   Final diagnoses:  Testicular pain  Acute bilateral low back pain without sciatica    New Prescriptions New Prescriptions   CYCLOBENZAPRINE (FLEXERIL) 10 MG TABLET    Take 1 tablet (10 mg total) by mouth 2 (two) times daily as needed for muscle spasms.     Donnetta Hutching, MD 06/10/16 [phone]

## 2016-06-10 NOTE — ED Triage Notes (Signed)
Pt complains of headache, heart palpitations, shortness of breath, low back pain, urge to urinate, and dizziness. Pt was called earlier today he had cyst in kidney.

## 2016-06-10 NOTE — Discharge Instructions (Signed)
Tests show no life-threatening condition. Prescription for muscle relaxer. Follow-up your primary care doctor.

## 2016-06-10 NOTE — ED Notes (Addendum)
Pt in DG at present time. Urinal left at bedside. Family aware pt need urine sample.

## 2016-06-12 DIAGNOSIS — R1011 Right upper quadrant pain: Secondary | ICD-10-CM | POA: Diagnosis present

## 2016-06-12 DIAGNOSIS — R1031 Right lower quadrant pain: Secondary | ICD-10-CM | POA: Diagnosis not present

## 2016-06-12 DIAGNOSIS — Z7982 Long term (current) use of aspirin: Secondary | ICD-10-CM | POA: Insufficient documentation

## 2016-06-12 DIAGNOSIS — Z955 Presence of coronary angioplasty implant and graft: Secondary | ICD-10-CM | POA: Diagnosis not present

## 2016-06-12 DIAGNOSIS — Z8673 Personal history of transient ischemic attack (TIA), and cerebral infarction without residual deficits: Secondary | ICD-10-CM | POA: Insufficient documentation

## 2016-06-12 DIAGNOSIS — F17210 Nicotine dependence, cigarettes, uncomplicated: Secondary | ICD-10-CM | POA: Diagnosis not present

## 2016-06-12 DIAGNOSIS — Z79899 Other long term (current) drug therapy: Secondary | ICD-10-CM | POA: Diagnosis not present

## 2016-06-12 DIAGNOSIS — I10 Essential (primary) hypertension: Secondary | ICD-10-CM | POA: Insufficient documentation

## 2016-06-12 LAB — URINALYSIS, ROUTINE W REFLEX MICROSCOPIC
Bacteria, UA: NONE SEEN
Bilirubin Urine: NEGATIVE
GLUCOSE, UA: NEGATIVE mg/dL
Hgb urine dipstick: NEGATIVE
Ketones, ur: NEGATIVE mg/dL
Nitrite: NEGATIVE
PROTEIN: NEGATIVE mg/dL
SQUAMOUS EPITHELIAL / LPF: NONE SEEN
Specific Gravity, Urine: 1.009 (ref 1.005–1.030)
pH: 6 (ref 5.0–8.0)

## 2016-06-12 LAB — COMPREHENSIVE METABOLIC PANEL
ALK PHOS: 72 U/L (ref 38–126)
ALT: 19 U/L (ref 17–63)
ANION GAP: 11 (ref 5–15)
AST: 21 U/L (ref 15–41)
Albumin: 3.9 g/dL (ref 3.5–5.0)
BUN: 9 mg/dL (ref 6–20)
CALCIUM: 8.7 mg/dL — AB (ref 8.9–10.3)
CO2: 21 mmol/L — AB (ref 22–32)
Chloride: 102 mmol/L (ref 101–111)
Creatinine, Ser: 0.85 mg/dL (ref 0.61–1.24)
GFR calc Af Amer: >60 mL/min (ref >60)
GFR calc non Af Amer: >60 mL/min (ref >60)
GLUCOSE: 104 mg/dL — AB (ref 65–99)
Potassium: 3.5 mmol/L (ref 3.5–5.1)
Sodium: 134 mmol/L — ABNORMAL LOW (ref 135–145)
Total Bilirubin: 0.3 mg/dL (ref 0.3–1.2)
Total Protein: 7.1 g/dL (ref 6.5–8.1)

## 2016-06-12 LAB — CBC
HEMATOCRIT: 42.2 % (ref 39.0–52.0)
HEMOGLOBIN: 14.6 g/dL (ref 13.0–17.0)
MCH: 29.2 pg (ref 26.0–34.0)
MCHC: 34.6 g/dL (ref 30.0–36.0)
MCV: 84.4 fL (ref 78.0–100.0)
PLATELETS: 270 10*3/uL (ref 150–400)
RBC: 5 MIL/uL (ref 4.22–5.81)
RDW: 12.4 % (ref 11.5–15.5)
WBC: 8.6 10*3/uL (ref 4.0–10.5)

## 2016-06-12 LAB — LIPASE, BLOOD: Lipase: 19 U/L (ref 11–51)

## 2016-06-12 NOTE — ED Triage Notes (Signed)
Pt complaining of R mid abdominal pain. Pt states pain radiates to lower back. Pt also complaining of dysuria and difficulty initiating urination. Pt states on going x 6 months.

## 2016-06-13 ENCOUNTER — Emergency Department (HOSPITAL_COMMUNITY): Payer: Medicare PPO

## 2016-06-13 ENCOUNTER — Emergency Department (HOSPITAL_COMMUNITY)
Admission: EM | Admit: 2016-06-13 | Discharge: 2016-06-13 | Disposition: A | Discharge status: Home / Self Care | Payer: Medicare PPO | Attending: Emergency Medicine | Admitting: Emergency Medicine

## 2016-06-13 DIAGNOSIS — R1031 Right lower quadrant pain: Secondary | ICD-10-CM

## 2016-06-13 DIAGNOSIS — R1011 Right upper quadrant pain: Secondary | ICD-10-CM

## 2016-06-13 MED ORDER — DICYCLOMINE HCL 20 MG PO TABS: 20.0000 mg | ORAL_TABLET | Freq: Two times a day (BID) | ORAL | 0 refills | Status: AC

## 2016-06-13 NOTE — ED Notes (Signed)
Pt understood dc material. NAD Noted. Script given at dc 

## 2016-06-13 NOTE — ED Notes (Signed)
Patient transported to X-ray 

## 2016-06-13 NOTE — ED Notes (Signed)
Pt c/o of SOB and dizziness.  Pt states his breathe taste "ammonia like".

## 2016-06-14 ENCOUNTER — Encounter (HOSPITAL_COMMUNITY): Payer: Self-pay | Admitting: Nurse Practitioner

## 2016-06-14 ENCOUNTER — Emergency Department (HOSPITAL_COMMUNITY)
Admission: EM | Admit: 2016-06-14 | Discharge: 2016-06-14 | Disposition: A | Discharge status: Home / Self Care | Payer: Medicare PPO | Attending: Emergency Medicine | Admitting: Emergency Medicine

## 2016-06-14 DIAGNOSIS — I10 Essential (primary) hypertension: Secondary | ICD-10-CM | POA: Insufficient documentation

## 2016-06-14 DIAGNOSIS — F17210 Nicotine dependence, cigarettes, uncomplicated: Secondary | ICD-10-CM | POA: Diagnosis not present

## 2016-06-14 DIAGNOSIS — Z7982 Long term (current) use of aspirin: Secondary | ICD-10-CM | POA: Insufficient documentation

## 2016-06-14 DIAGNOSIS — K5900 Constipation, unspecified: Secondary | ICD-10-CM | POA: Diagnosis not present

## 2016-06-14 DIAGNOSIS — Z8673 Personal history of transient ischemic attack (TIA), and cerebral infarction without residual deficits: Secondary | ICD-10-CM | POA: Insufficient documentation

## 2016-06-14 DIAGNOSIS — Z955 Presence of coronary angioplasty implant and graft: Secondary | ICD-10-CM | POA: Diagnosis not present

## 2016-06-14 DIAGNOSIS — Z79899 Other long term (current) drug therapy: Secondary | ICD-10-CM | POA: Diagnosis not present

## 2016-06-14 DIAGNOSIS — R109 Unspecified abdominal pain: Secondary | ICD-10-CM | POA: Diagnosis present

## 2016-06-14 NOTE — Discharge Instructions (Signed)
Use magnesium citrate, one half bottle today, and one half tomorrow.  After that, take Colace twice a day, to improve stooling.  For pain, use Tylenol.

## 2016-06-14 NOTE — ED Provider Notes (Signed)
[facility]-EMERG[facility] DEPT Provider Note   CSN: [id number] Arrival date & time: 06/14/16  0901     History   Chief Complaint Chief Complaint  Patient presents with  . Abdominal Pain  . Back Pain    HPI Luke Bowman is a 36 y.o. male. The patient presents for evaluation of multiple complaints, by EMS.  He has had numerous evaluations in the ED, with negative findings over the last 6 weeks.  Today, the patient is complaining of right-sided abdominal pain, which started this morning.  He also cannot recall the last time he had a bowel movement.  He states that he feels hot on the back of his head, and today his left arm was heavy for a period of time but then that resolved.  He denies fever, chills, nausea, vomiting, cough, chest pain, weakness or dizziness.  The patient is worried about 2 things, #1 appendicitis and #2 a prosthetic stone.  He showed me his CT image results, from 06/06/16, done at [facility], emergency department.  The impression included a statement about a possible 4 mm prostatic stone versus intraurethral calcification possibly consistent with a renal stone.  I discussed these findings with the patient and the recommendation for urology follow-up regarding a possible prostatic calcification.  Patient admits to having numerous CT scans done in the last year, as he was being worked up and evaluated for pain, when he was in [place].  Patient has recently moved from [place], where he was troubled by mold in his home, and is [place].  There are no other known modifying factors.   HPI  Past Medical History:  Diagnosis Date  . Hypertension   . Stroke (HCC)   . TIA (transient ischemic attack)     There are no active problems to display for this patient.   Past Surgical History:  Procedure Laterality Date  . CORONARY ANGIOPLASTY WITH STENT PLACEMENT         Home Medications    Prior to Admission medications   Medication  Sig Start Date End Date Taking? Authorizing Provider  aspirin EC 81 MG tablet Take 81 mg by mouth daily.     Historical Provider, MD  cyclobenzaprine (FLEXERIL) 10 MG tablet Take 1 tablet (10 mg total) by mouth 2 (two) times daily as needed for muscle spasms. 06/10/16   Donnetta Hutching, MD  dicyclomine (BENTYL) 20 MG tablet Take 1 tablet (20 mg total) by mouth 2 (two) times daily. 06/13/16   Elpidio Anis, PA-C    Family History History reviewed. No pertinent family history.  Social History Social History  Substance Use Topics  . Smoking status: Current Every Day Smoker    Types: Cigarettes  . Smokeless tobacco: Never Used  . Alcohol use No     Allergies   Toradol [ketorolac tromethamine]; Dilaudid [hydromorphone hcl]; Metoprolol; Hydromorphone; Propranolol; and Tramadol   Review of Systems Review of Systems  All other systems reviewed and are negative.    Physical Exam Updated Vital Signs BP 122/70 (BP Location: Right Arm)   Pulse 82   Temp 98.1 F (36.7 C) (Oral)   Resp 18   Ht  (1.803 m)   Wt 193 lb (87.5 kg)   SpO2 99%   BMI 26.92 kg/m   Physical Exam  Constitutional: He is oriented to person, place, and time. He appears well-developed and well-nourished. He appears distressed (He is anxious).  HENT:  Head: Normocephalic and atraumatic.  Right Ear: External ear normal.  Left Ear: External ear normal.  Eyes: Conjunctivae and EOM are normal. Pupils are equal, round, and reactive to light.  Neck: Normal range of motion and phonation normal. Neck supple.  Cardiovascular: Normal rate, regular rhythm and normal heart sounds.   Pulmonary/Chest: Effort normal and breath sounds normal. He exhibits no bony tenderness.  Abdominal: Soft. He exhibits no mass. There is tenderness (Right upper and lower abdomen, mild). There is no rebound and no guarding.  Musculoskeletal: Normal range of motion.  Neurological: He is alert and oriented to person, place, and time. No cranial  nerve deficit or sensory deficit. He exhibits normal muscle tone. Coordination normal.  Skin: Skin is warm, dry and intact.  Psychiatric: His behavior is normal. Judgment and thought content normal.  Mild anxiety  Nursing note and vitals reviewed.    ED Treatments / Results  Labs (all labs ordered are listed, but only abnormal results are displayed) Labs Reviewed - No data to display  EKG  EKG Interpretation None       Radiology Dg Abdomen Acute W/chest  Result Date: 06/13/2016 CLINICAL DATA:  Right upper quadrant and mid abdominal pain on and off since August. Cholecystectomy. EXAM: DG ABDOMEN ACUTE W/ 1V CHEST COMPARISON:  CXR from 06/10/2016 FINDINGS: Normal size cardiac silhouette and aorta. Mild hyperinflation the lungs with left basilar atelectasis. No effusion or pneumothorax. Cholecystectomy clips are seen in the right upper quadrant. There are a few slightly dilated gas-filled small bowel loops without obstruction in the left hemiabdomen. No pneumoperitoneum. No organomegaly or suspicious calculi. No acute osseous abnormality. IMPRESSION: Left basilar atelectasis. A few scattered minimally distended gas-filled small bowel loops are seen in the left hemiabdomen. Findings are nonspecific but may represent a mild enteritis or potentially localized small bowel ileus. Electronically Signed   By: Tollie Eth M.D.   On: 06/13/2016 03:37    Procedures Procedures (including critical care time)  Medications Ordered in ED Medications - No data to display   Initial Impression / Assessment and Plan / ED Course  I have reviewed the triage vital signs and the nursing notes.  Pertinent labs & imaging results that were available during my care of the patient were reviewed by me and considered in my medical decision making (see chart for details).     Medications - No data to display  Patient Vitals for the past 24 hrs:  BP Temp Temp src Pulse Resp SpO2 Height Weight  06/14/16 1101  122/70 - - 82 18 99 % - -  06/14/16 0907 111/73 98.1 F (36.7 C) Oral 84 14 100 %  (1.803 m) 193 lb (87.5 kg)  06/14/16 0906 - - - - - -  (1.803 m) 196 lb (88.9 kg)  06/14/16 0902 - - - - - 97 % - -    11:08 AM Reevaluation with update and discussion. After initial assessment and treatment, an updated evaluation reveals no change in clinical status at this time.  I discussed the patient's clinical exam, his recent imaging and laboratory results and how his clinical impression should direct his evaluation.  My clinical impression, is that the patient has abdominal pain secondary to constipation.  He has had very recent extensive evaluations making serious problems unlikely.  He does not present with typical clinical findings for appendicitis, or metabolic instability.  Patient agrees with this discussion, and he prefers to avoid another high radiation dose imaging study.  All findings discussed with patient  and all questions answered. Benigna Delisi L    Final Clinical Impressions(s) / ED Diagnoses   Final diagnoses:  Constipation, unspecified constipation type   Nonspecific complaints, with reassuring evaluation.  Doubt appendicitis, serious bacterial infection, metabolic instability or impending vascular collapse.  It is important that the patient find a primary care doctor as soon as possible to manage his ongoing symptoms, and reduce his utilization in the emergency department.  Nursing Notes Reviewed/ Care Coordinated Applicable Imaging Reviewed Interpretation of Laboratory Data incorporated into ED treatment  The patient appears reasonably screened and/or stabilized for discharge and I doubt any other medical condition or other [facility] requiring further screening, evaluation, or treatment in the ED at this time prior to discharge.  Plan: Home Medications- apap FOR PAIN, magnesium citrate followed by Colace, for constipation; Home Treatments-rest, fluids; return here if the  recommended treatment, does not improve the symptoms; Recommended follow up-PCP, as needed   New Prescriptions Discharge Medication List as of 06/14/2016 11:02 AM       Mancel Bale, MD 06/14/16 1109

## 2016-06-14 NOTE — Discharge Planning (Signed)
[facility] consulted regarding pt numerous visits to ED.  EDCM notes that pt has [place] address, will contact pt to inquire about pt intentions on becoming [place] resident and help with PCP establishment from there.

## 2016-06-14 NOTE — ED Triage Notes (Signed)
Pt brought in by ems. Per ems patient states he started having left arm weakness early this morning but after walking around it got better per person. States he later started having right lower abdomen pain that radiates to his back. Per ems he stated he had a kidney stone according to his urologist. When asked later the name of the Urologist he stated he forgot he actually hadn't seen one. Also complained of lower back pain which he informed them that his doctor keeps saying that he is fine nothing is wrong with his back. Patient was ambulatory to ems truck without complaints and was on the phone laughing. When en route and discussing health per ems patient appeared anxious and had multiple versions of why he was coming to ED. VSS 128/84, 72, 18, cbg 131, NSR.

## 2016-06-14 NOTE — ED Notes (Signed)
Bed: WA12 Expected date:  Expected time:  Means of arrival:  Comments: EMS abdominal pain 

## 2016-06-18 NOTE — ED Provider Notes (Signed)
[facility] Provider Note   CSN: [id number] Arrival date & time: 06/12/16  2053     History   Chief Complaint Chief Complaint  Patient presents with  . Abdominal Pain    HPI Luke Bowman is a 36 y.o. male.  Patient to ED with c/o right sided upper and lower abdominal pain x 6 months. No nausea, vomiting, fever or diarrhea. He denies melena but has seen BRB in his stools. He is also complaining of dysuria, also ongoing for months. No testicular pain or scrotal swelling. He denies chest pain, SOB, fatigue or weakness.    The history is provided by the patient and the spouse. No language interpreter was used.    Past Medical History:  Diagnosis Date  . Hypertension   . Stroke (HCC)   . TIA (transient ischemic attack)     There are no active problems to display for this patient.   Past Surgical History:  Procedure Laterality Date  . CORONARY ANGIOPLASTY WITH STENT PLACEMENT         Home Medications    Prior to Admission medications   Medication Sig Start Date End Date Taking? Authorizing Provider  aspirin EC 81 MG tablet Take 81 mg by mouth daily.    Yes Historical Provider, MD  cyclobenzaprine (FLEXERIL) 10 MG tablet Take 1 tablet (10 mg total) by mouth 2 (two) times daily as needed for muscle spasms. 06/10/16   Donnetta Hutching, MD  dicyclomine (BENTYL) 20 MG tablet Take 1 tablet (20 mg total) by mouth 2 (two) times daily. 06/13/16   Elpidio Anis, PA-C    Family History No family history on file.  Social History Social History  Substance Use Topics  . Smoking status: Current Every Day Smoker    Types: Cigarettes  . Smokeless tobacco: Never Used  . Alcohol use No     Allergies   Toradol [ketorolac tromethamine]; Dilaudid [hydromorphone hcl]; Metoprolol; Hydromorphone; Propranolol; and Tramadol   Review of Systems Review of Systems  Constitutional: Negative for chills, fatigue, fever and unexpected weight change.  HENT: Negative.   Respiratory:  Negative.   Cardiovascular: Negative.   Gastrointestinal: Positive for abdominal pain and blood in stool. Negative for vomiting.  Genitourinary: Positive for dysuria.  Musculoskeletal: Negative.  Negative for myalgias.  Skin: Negative.  Negative for pallor.  Neurological: Negative.  Negative for weakness.     Physical Exam Updated Vital Signs BP 118/86   Pulse 75   Temp 97.9 F (36.6 C) (Oral)   Resp 16   SpO2 100%   Physical Exam  Constitutional: He is oriented to person, place, and time. He appears well-developed and well-nourished.  HENT:  Head: Normocephalic.  Eyes:  No conjunctival pallor.   Neck: Normal range of motion. Neck supple.  Cardiovascular: Normal rate and regular rhythm.   Pulmonary/Chest: Effort normal and breath sounds normal.  Abdominal: Soft. Bowel sounds are normal. There is no tenderness. There is no rebound and no guarding.  Genitourinary: Rectal exam shows guaiac negative stool.  Genitourinary Comments: No CVA tenderness.   Musculoskeletal: Normal range of motion.  Neurological: He is alert and oriented to person, place, and time.  Skin: Skin is warm and dry. No rash noted.  Psychiatric: He has a normal mood and affect.     ED Treatments / Results  Labs (all labs ordered are listed, but only abnormal results are displayed) Labs Reviewed  COMPREHENSIVE METABOLIC PANEL - Abnormal; Notable for the following:  Result Value   Sodium 134 (*)    CO2 21 (*)    Glucose, Bld 104 (*)    Calcium 8.7 (*)    All other components within normal limits  URINALYSIS, ROUTINE W REFLEX MICROSCOPIC - Abnormal; Notable for the following:    Leukocytes, UA TRACE (*)    All other components within normal limits  LIPASE, BLOOD  CBC   Results for orders placed or performed during the hospital encounter of 06/13/16  Lipase, blood  Result Value Ref Range   Lipase 19 11 - 51 U/L  Comprehensive metabolic panel  Result Value Ref Range   Sodium 134 (L) 135 -  145 mmol/L   Potassium 3.5 3.5 - 5.1 mmol/L   Chloride 102 101 - 111 mmol/L   CO2 21 (L) 22 - 32 mmol/L   Glucose, Bld 104 (H) 65 - 99 mg/dL   BUN 9 6 - 20 mg/dL   Creatinine, Ser 4.09 0.61 - 1.24 mg/dL   Calcium 8.7 (L) 8.9 - 10.3 mg/dL   Total Protein 7.1 6.5 - 8.1 g/dL   Albumin 3.9 3.5 - 5.0 g/dL   AST 21 15 - 41 U/L   ALT 19 17 - 63 U/L   Alkaline Phosphatase 72 38 - 126 U/L   Total Bilirubin 0.3 0.3 - 1.2 mg/dL   GFR calc non Af Amer >60 >60 mL/min   GFR calc Af Amer >60 >60 mL/min   Anion gap 11 5 - 15  CBC  Result Value Ref Range   WBC 8.6 4.0 - 10.5 K/uL   RBC 5.00 4.22 - 5.81 MIL/uL   Hemoglobin 14.6 13.0 - 17.0 g/dL   HCT 81.1 91.4 - 78.2 %   MCV 84.4 78.0 - 100.0 fL   MCH 29.2 26.0 - 34.0 pg   MCHC 34.6 30.0 - 36.0 g/dL   RDW 95.6 21.3 - 08.6 %   Platelets 270 150 - 400 K/uL  Urinalysis, Routine w reflex microscopic  Result Value Ref Range   Color, Urine YELLOW YELLOW   APPearance CLEAR CLEAR   Specific Gravity, Urine 1.009 1.005 - 1.030   pH 6.0 5.0 - 8.0   Glucose, UA NEGATIVE NEGATIVE mg/dL   Hgb urine dipstick NEGATIVE NEGATIVE   Bilirubin Urine NEGATIVE NEGATIVE   Ketones, ur NEGATIVE NEGATIVE mg/dL   Protein, ur NEGATIVE NEGATIVE mg/dL   Nitrite NEGATIVE NEGATIVE   Leukocytes, UA TRACE (A) NEGATIVE   RBC / HPF 0-5 0 - 5 RBC/hpf   WBC, UA 0-5 0 - 5 WBC/hpf   Bacteria, UA NONE SEEN NONE SEEN   Squamous Epithelial / LPF NONE SEEN NONE SEEN     EKG  EKG Interpretation None       Radiology No results found.  Procedures Procedures (including critical care time)  Medications Ordered in ED Medications - No data to display   Initial Impression / Assessment and Plan / ED Course  I have reviewed the triage vital signs and the nursing notes.  Pertinent labs & imaging results that were available during my care of the patient were reviewed by me and considered in my medical decision making (see chart for details).     Patient with ongoing  abdominal pain and dysuria. He reports bloody stools but is guaiac negative here. Normal Hgb. He appears well and in NAD.   He is strongly encouraged to follow up outpatient with PCP for further evaluation. He is felt appropriate for discharge home.   Final  Clinical Impressions(s) / ED Diagnoses   Final diagnoses:  Right lower quadrant abdominal pain  Right upper quadrant abdominal pain    New Prescriptions Discharge Medication List as of 06/13/2016  3:50 AM    START taking these medications   Details  dicyclomine (BENTYL) 20 MG tablet Take 1 tablet (20 mg total) by mouth 2 (two) times daily., Starting Mon 06/13/2016, Print         Elpidio Anis, PA-C 06/18/16 2332    Melene Plan, DO 06/19/16 1610

## 2016-06-20 ENCOUNTER — Encounter (HOSPITAL_COMMUNITY): Payer: Self-pay

## 2016-06-20 ENCOUNTER — Emergency Department (HOSPITAL_COMMUNITY)
Admission: EM | Admit: 2016-06-20 | Discharge: 2016-06-20 | Disposition: A | Discharge status: Home / Self Care | Payer: Medicare PPO | Attending: Emergency Medicine | Admitting: Emergency Medicine

## 2016-06-20 ENCOUNTER — Emergency Department (HOSPITAL_COMMUNITY): Payer: Medicare PPO

## 2016-06-20 DIAGNOSIS — Z7982 Long term (current) use of aspirin: Secondary | ICD-10-CM | POA: Diagnosis not present

## 2016-06-20 DIAGNOSIS — Z8673 Personal history of transient ischemic attack (TIA), and cerebral infarction without residual deficits: Secondary | ICD-10-CM | POA: Diagnosis not present

## 2016-06-20 DIAGNOSIS — I10 Essential (primary) hypertension: Secondary | ICD-10-CM | POA: Diagnosis not present

## 2016-06-20 DIAGNOSIS — Z79899 Other long term (current) drug therapy: Secondary | ICD-10-CM | POA: Diagnosis not present

## 2016-06-20 DIAGNOSIS — R109 Unspecified abdominal pain: Secondary | ICD-10-CM | POA: Insufficient documentation

## 2016-06-20 DIAGNOSIS — Z955 Presence of coronary angioplasty implant and graft: Secondary | ICD-10-CM | POA: Diagnosis not present

## 2016-06-20 DIAGNOSIS — F17210 Nicotine dependence, cigarettes, uncomplicated: Secondary | ICD-10-CM | POA: Insufficient documentation

## 2016-06-20 LAB — URINALYSIS, ROUTINE W REFLEX MICROSCOPIC
BILIRUBIN URINE: NEGATIVE
Glucose, UA: NEGATIVE mg/dL
HGB URINE DIPSTICK: NEGATIVE
KETONES UR: NEGATIVE mg/dL
Leukocytes, UA: NEGATIVE
NITRITE: NEGATIVE
PH: 7 (ref 5.0–8.0)
Protein, ur: NEGATIVE mg/dL
SPECIFIC GRAVITY, URINE: 1.001 — AB (ref 1.005–1.030)

## 2016-06-20 LAB — CBC WITH DIFFERENTIAL/PLATELET
BASOS ABS: 0 10*3/uL (ref 0.0–0.1)
Basophils Relative: 0 %
EOS ABS: 0.1 10*3/uL (ref 0.0–0.7)
EOS PCT: 1 %
HCT: 41.2 % (ref 39.0–52.0)
Hemoglobin: 14.3 g/dL (ref 13.0–17.0)
Lymphocytes Relative: 22 %
Lymphs Abs: 1.9 10*3/uL (ref 0.7–4.0)
MCH: 29.5 pg (ref 26.0–34.0)
MCHC: 34.7 g/dL (ref 30.0–36.0)
MCV: 85.1 fL (ref 78.0–100.0)
MONO ABS: 0.5 10*3/uL (ref 0.1–1.0)
Monocytes Relative: 5 %
Neutro Abs: 6.4 10*3/uL (ref 1.7–7.7)
Neutrophils Relative %: 72 %
PLATELETS: 240 10*3/uL (ref 150–400)
RBC: 4.84 MIL/uL (ref 4.22–5.81)
RDW: 12.5 % (ref 11.5–15.5)
WBC: 8.9 10*3/uL (ref 4.0–10.5)

## 2016-06-20 LAB — COMPREHENSIVE METABOLIC PANEL
ALT: 21 U/L (ref 17–63)
AST: 21 U/L (ref 15–41)
Albumin: 4.3 g/dL (ref 3.5–5.0)
Alkaline Phosphatase: 68 U/L (ref 38–126)
Anion gap: 14 (ref 5–15)
BUN: 5 mg/dL — ABNORMAL LOW (ref 6–20)
CHLORIDE: 105 mmol/L (ref 101–111)
CO2: 21 mmol/L — AB (ref 22–32)
CREATININE: 0.83 mg/dL (ref 0.61–1.24)
Calcium: 8.9 mg/dL (ref 8.9–10.3)
GFR calc non Af Amer: >60 mL/min (ref >60)
Glucose, Bld: 91 mg/dL (ref 65–99)
Potassium: 4 mmol/L (ref 3.5–5.1)
SODIUM: 140 mmol/L (ref 135–145)
Total Bilirubin: 0.3 mg/dL (ref 0.3–1.2)
Total Protein: 6.7 g/dL (ref 6.5–8.1)

## 2016-06-20 MED ORDER — SODIUM CHLORIDE 0.9 % IV BOLUS (SEPSIS)
1000.0000 mL | Freq: Once | INTRAVENOUS | Status: AC
  Administered 2016-06-20: 1000 mL via INTRAVENOUS

## 2016-06-20 NOTE — ED Notes (Signed)
Patient verbalized understanding of discharge instructions and denies any further needs or questions at this time. VS stable. Patient ambulatory with steady gait.  

## 2016-06-20 NOTE — ED Triage Notes (Signed)
PER EMS: pt reports sudden onset of stabbing lower middle back pain while he was standing outside, worse with movement. Pt took  tylenol PTA. BP-118/84, HR-94, 99% RA. He reports he was diagnosed with a kidney stone on April 4th.

## 2016-06-20 NOTE — ED Provider Notes (Signed)
[facility] Provider Note   CSN: [id number] Arrival date & time: 06/20/16  1335     History   Chief Complaint Chief Complaint  Patient presents with  . Back Pain    HPI Luke Bowman is a 36 y.o. male.  The history is provided by the patient and medical records. No language interpreter was used.  Back Pain   Associated symptoms include dysuria. Pertinent negatives include no fever.   Luke Bowman is a 36 y.o. male who presents to the Emergency Department complaining of Acute onset of right low back pain which radiates around his right flank and down into his right groin. Started today just prior to arrival. No urinary hesitancy/urgency/frequency, but does state that it "stings at the tip" when he urinates. Associated with nausea, but no emesis. Tylenol taken with no relief. History of similar incidents in the last month where he was seen at [facility] ED and told he likely had kidney stones. CT scan at that time showed a possible 4 mm stone is identified in the prostatic urethra. Patient states pain resolved after that visit, but has now returned. No fevers/chills, constipation, diarrhea, chest pain or shortness of breath. No alleviating or aggravating factors noted.    Past Medical History:  Diagnosis Date  . Hypertension   . Stroke (HCC)   . TIA (transient ischemic attack)     There are no active problems to display for this patient.   Past Surgical History:  Procedure Laterality Date  . CORONARY ANGIOPLASTY WITH STENT PLACEMENT         Home Medications    Prior to Admission medications   Medication Sig Start Date End Date Taking? Authorizing Provider  aspirin EC 81 MG tablet Take 81 mg by mouth daily.     Historical Provider, MD  cyclobenzaprine (FLEXERIL) 10 MG tablet Take 1 tablet (10 mg total) by mouth 2 (two) times daily as needed for muscle spasms. 06/10/16   Donnetta Hutching, MD  dicyclomine (BENTYL) 20 MG tablet Take 1 tablet (20 mg total) by mouth 2  (two) times daily. 06/13/16   Elpidio Anis, PA-C    Family History No family history on file.  Social History Social History  Substance Use Topics  . Smoking status: Current Every Day Smoker    Types: Cigarettes  . Smokeless tobacco: Never Used  . Alcohol use No     Allergies   Toradol [ketorolac tromethamine]; Dilaudid [hydromorphone hcl]; Metoprolol; Hydromorphone; Propranolol; and Tramadol   Review of Systems Review of Systems  Constitutional: Negative for fever.  Genitourinary: Positive for dysuria and frequency. Negative for difficulty urinating.  Musculoskeletal: Positive for back pain.  All other systems reviewed and are negative.    Physical Exam Updated Vital Signs BP 129/89 (BP Location: Right Arm)   Pulse 81   Temp 98.7 F (37.1 C)   Resp 14   Ht  (1.803 m)   Wt 88.9 kg   SpO2 99%   BMI 27.34 kg/m   Physical Exam  Constitutional: He is oriented to person, place, and time. He appears well-developed and well-nourished. No distress.  Non-toxic appearing. Comfortable resting in the bed.   HENT:  Head: Normocephalic and atraumatic.  Cardiovascular: Normal rate, regular rhythm and normal heart sounds.   No murmur heard. Pulmonary/Chest: Effort normal and breath sounds normal. No respiratory distress.  Abdominal: Soft. He exhibits no distension.  Tenderness to palpation of right low back and right flank with no overlying skin changes. No  CVA tenderness or abdominal tenderness.   Musculoskeletal: He exhibits no edema.  Neurological: He is alert and oriented to person, place, and time.  Skin: Skin is warm and dry.  Nursing note and vitals reviewed.    ED Treatments / Results  Labs (all labs ordered are listed, but only abnormal results are displayed) Labs Reviewed  COMPREHENSIVE METABOLIC PANEL - Abnormal; Notable for the following:       Result Value   CO2 21 (*)    BUN 5 (*)    All other components within normal limits  URINALYSIS, ROUTINE  W REFLEX MICROSCOPIC - Abnormal; Notable for the following:    Color, Urine STRAW (*)    Specific Gravity, Urine 1.001 (*)    All other components within normal limits  CBC WITH DIFFERENTIAL/PLATELET    EKG  EKG Interpretation None       Radiology Ct Renal Stone Study  Result Date: 06/20/2016 CLINICAL DATA:  Pt c/o low back pain for several weeks and now rt flank with frequent urination Hx of cholecystectomy. Evaluate for renal stones EXAM: CT ABDOMEN AND PELVIS WITHOUT CONTRAST TECHNIQUE: Multidetector CT imaging of the abdomen and pelvis was performed following the standard protocol without IV contrast. COMPARISON:  CT abdomen dated 05/06/2016. FINDINGS: Lower chest: No acute abnormality. Hepatobiliary: No focal liver abnormality is seen. Status post cholecystectomy. No biliary dilatation. Pancreas: Unremarkable. No pancreatic ductal dilatation or surrounding inflammatory changes. Spleen: Normal in size without focal abnormality. Adrenals/Urinary Tract: The adrenal glands appear normal. Kidneys are unremarkable without mass, stone or hydronephrosis. No perinephric inflammation. No ureteral or bladder calculi identified. Bladder appears normal. 3 mm calcification is seen within the midline prostate, potentially a stone within the prostatic urethra. Stomach/Bowel: Bowel is normal in caliber. No bowel wall thickening or evidence of bowel wall inflammation. Appendix is normal. Vascular/Lymphatic: Abdominal aorta is normal in caliber. No enlarged lymph nodes seen within the abdomen or pelvis. Reproductive: Unremarkable. Other: No free fluid or abscess collection. No free intraperitoneal air. Musculoskeletal: No acute or suspicious osseous finding. The superficial soft tissues are unremarkable. IMPRESSION: 1. 3 mm calcification within the midline prostate, potentially a stone within the prostatic urethra given patient's symptoms, otherwise merely a benign calcification within the central prostatic  parenchyma. Recommend correlation with urinalysis and consider urology consultation for further workup considerations. 2. No renal, ureteral or bladder calculi. No hydronephrosis. No perinephric inflammation. 3. Remainder of the abdomen and pelvis CT is unremarkable, as detailed above. No bowel obstruction or evidence of bowel wall inflammation. No free fluid. Appendix is normal. Electronically Signed   By: Bary Richard M.D.   On: 06/20/2016 15:51    Procedures Procedures (including critical care time)  Medications Ordered in ED Medications  sodium chloride 0.9 % bolus 1,000 mL (0 mLs Intravenous Stopped 06/20/16 1543)     Initial Impression / Assessment and Plan / ED Course  I have reviewed the triage vital signs and the nursing notes.  Pertinent labs & imaging results that were available during my care of the patient were reviewed by me and considered in my medical decision making (see chart for details).    Luke Bowman is a 36 y.o. male who presents to ED for right back/flank pain which began today. History of similar in the past which patient reports was due to kidney stones. On exam, patient is afebrile, well-appearing and hemodynamically stable with tenderness to palpation along the right back/flank. No overt CVA tenderness. No abdominal tenderness. Resting comfortably in  bed on evaluation. UA with no signs of infection and no blood noted. Labs reviewed and reassuring. CT stone study shows a 3 mm calcification within the prostate, potentially a stone within the urethra or a benign calcification. No hydronephrosis or perinephric inflammation. No renal, ureteral or bladder calculi. Patient had a CT scan performed a few weeks ago at [facility] showing a 4 mm calcification in a similar location. Given return of symptoms and the second visit with this imaging, I put in consult to urology. I went to bedside to inform patient of plan and updated him on CT findings. He states that he would  like to go ahead and leave, following up with urology as an outpatient, instead of awaiting urology recommendations. Evaluation does not show pathology that would require ongoing emergent intervention or inpatient treatment. Outpatient urology follow up instead of awaiting urology recommendations is appropriate. Reasons to return to ER discussed. Urology referral information provided. All questions answered.   Final Clinical Impressions(s) / ED Diagnoses   Final diagnoses:  Right flank pain    New Prescriptions Discharge Medication List as of 06/20/2016  5:17 PM       Chase Picket Ward, PA-C 06/20/16 1815    Arby Barrette, MD 06/21/16 1717

## 2016-06-20 NOTE — Discharge Instructions (Signed)
Please call the urology clinic listed to schedule a follow up appointment.  Return to ER for new or worsening symptoms, any additional concerns.

## 2016-07-07 ENCOUNTER — Encounter (HOSPITAL_COMMUNITY): Payer: Self-pay | Admitting: *Deleted

## 2016-07-07 ENCOUNTER — Emergency Department (HOSPITAL_COMMUNITY)
Admission: EM | Admit: 2016-07-07 | Discharge: 2016-07-07 | Disposition: A | Discharge status: Home / Self Care | Payer: Medicare PPO | Attending: Emergency Medicine | Admitting: Emergency Medicine

## 2016-07-07 DIAGNOSIS — R1084 Generalized abdominal pain: Secondary | ICD-10-CM | POA: Insufficient documentation

## 2016-07-07 DIAGNOSIS — G8929 Other chronic pain: Secondary | ICD-10-CM | POA: Insufficient documentation

## 2016-07-07 DIAGNOSIS — F17210 Nicotine dependence, cigarettes, uncomplicated: Secondary | ICD-10-CM | POA: Insufficient documentation

## 2016-07-07 DIAGNOSIS — Z8673 Personal history of transient ischemic attack (TIA), and cerebral infarction without residual deficits: Secondary | ICD-10-CM | POA: Diagnosis not present

## 2016-07-07 DIAGNOSIS — I10 Essential (primary) hypertension: Secondary | ICD-10-CM | POA: Insufficient documentation

## 2016-07-07 DIAGNOSIS — R109 Unspecified abdominal pain: Secondary | ICD-10-CM

## 2016-07-07 DIAGNOSIS — Z955 Presence of coronary angioplasty implant and graft: Secondary | ICD-10-CM | POA: Diagnosis not present

## 2016-07-07 LAB — COMPREHENSIVE METABOLIC PANEL
ALBUMIN: 4.2 g/dL (ref 3.5–5.0)
ALT: 30 U/L (ref 17–63)
AST: 23 U/L (ref 15–41)
Alkaline Phosphatase: 77 U/L (ref 38–126)
Anion gap: 8 (ref 5–15)
BUN: 5 mg/dL — ABNORMAL LOW (ref 6–20)
CHLORIDE: 110 mmol/L (ref 101–111)
CO2: 22 mmol/L (ref 22–32)
Calcium: 8.9 mg/dL (ref 8.9–10.3)
Creatinine, Ser: 0.85 mg/dL (ref 0.61–1.24)
GFR calc Af Amer: >60 mL/min (ref >60)
GFR calc non Af Amer: >60 mL/min (ref >60)
GLUCOSE: 77 mg/dL (ref 65–99)
POTASSIUM: 3.9 mmol/L (ref 3.5–5.1)
Sodium: 140 mmol/L (ref 135–145)
Total Bilirubin: 0.3 mg/dL (ref 0.3–1.2)
Total Protein: 6.8 g/dL (ref 6.5–8.1)

## 2016-07-07 LAB — CBC WITH DIFFERENTIAL/PLATELET
Basophils Absolute: 0 10*3/uL (ref 0.0–0.1)
Basophils Relative: 0 %
EOS PCT: 1 %
Eosinophils Absolute: 0 10*3/uL (ref 0.0–0.7)
HCT: 45.1 % (ref 39.0–52.0)
Hemoglobin: 15.2 g/dL (ref 13.0–17.0)
LYMPHS ABS: 1.5 10*3/uL (ref 0.7–4.0)
LYMPHS PCT: 18 %
MCH: 29.1 pg (ref 26.0–34.0)
MCHC: 33.7 g/dL (ref 30.0–36.0)
MCV: 86.2 fL (ref 78.0–100.0)
MONO ABS: 0.5 10*3/uL (ref 0.1–1.0)
Monocytes Relative: 6 %
Neutro Abs: 6.6 10*3/uL (ref 1.7–7.7)
Neutrophils Relative %: 75 %
PLATELETS: 219 10*3/uL (ref 150–400)
RBC: 5.23 MIL/uL (ref 4.22–5.81)
RDW: 12.6 % (ref 11.5–15.5)
WBC: 8.7 10*3/uL (ref 4.0–10.5)

## 2016-07-07 LAB — URINALYSIS, ROUTINE W REFLEX MICROSCOPIC
Bilirubin Urine: NEGATIVE
Glucose, UA: NEGATIVE mg/dL
HGB URINE DIPSTICK: NEGATIVE
KETONES UR: NEGATIVE mg/dL
Leukocytes, UA: NEGATIVE
Nitrite: NEGATIVE
PH: 7 (ref 5.0–8.0)
PROTEIN: NEGATIVE mg/dL
Specific Gravity, Urine: 1.002 — ABNORMAL LOW (ref 1.005–1.030)

## 2016-07-07 LAB — POC OCCULT BLOOD, ED: Fecal Occult Bld: NEGATIVE

## 2016-07-07 LAB — LIPASE, BLOOD: Lipase: 22 U/L (ref 11–51)

## 2016-07-07 MED ORDER — SODIUM CHLORIDE 0.9 % IV BOLUS (SEPSIS)
1000.0000 mL | Freq: Once | INTRAVENOUS | Status: AC
  Administered 2016-07-07: 1000 mL via INTRAVENOUS

## 2016-07-07 NOTE — ED Provider Notes (Signed)
[facility] Provider Note   CSN: [id number] Arrival date & time: 07/07/16  1034     History   Chief Complaint Chief Complaint  Patient presents with  . Dehydration  . Melena    HPI Luke Bowman is a 36 y.o. male.  The history is provided by the patient and medical records. No language interpreter was used.   Luke Bowman is a 36 y.o. male  with a PMH of HTN, prior stroke who presents to the Emergency Department complaining of acute worsening of chronic generalized abdominal pain over the last 2 days. Associated symptoms include nausea and dark tarry stools. Notes he has had loose stools for the last two weeks. This will intermittently come and go over the last few years now. Has felt a little weak lately. Gets dizzy when he stands up too quickly. No fever/chills, dysuria, back pain, chest pain or trouble breathing. No medications taken prior to arrival for symptoms. No iron supplements or pepto. Patient states he has been diagnosed with Celiac disease by End[place] when [place] was [place]. He was told that this could be contributory to his abdominal pain. Has not made dietary changes and does not seem to understand celiac dz diagnosis.   Past Medical History:  Diagnosis Date  . Hypertension   . Stroke (HCC)   . TIA (transient ischemic attack)     There are no active problems to display for this patient.   Past Surgical History:  Procedure Laterality Date  . CORONARY ANGIOPLASTY WITH STENT PLACEMENT         Home Medications    Prior to Admission medications   Medication Sig Start Date End Date Taking? Authorizing Provider  aspirin EC 81 MG tablet Take 81 mg by mouth daily.     Historical Provider, MD  cyclobenzaprine (FLEXERIL) 10 MG tablet Take 1 tablet (10 mg total) by mouth 2 (two) times daily as needed for muscle spasms. 06/10/16   Donnetta HutchingBrian Cook, MD  dicyclomine (BENTYL) 20 MG tablet Take 1 tablet (20 mg total) by mouth 2 (two) times daily. 06/13/16   Elpidio AnisShari  Upstill, PA-C    Family History History reviewed. No pertinent family history.  Social History Social History  Substance Use Topics  . Smoking status: Current Every Day Smoker    Types: Cigarettes  . Smokeless tobacco: Never Used  . Alcohol use No     Allergies   Toradol [ketorolac tromethamine]; Dilaudid [hydromorphone hcl]; Metoprolol; Hydromorphone; Propranolol; and Tramadol   Review of Systems Review of Systems  Gastrointestinal: Positive for abdominal pain and diarrhea. Negative for constipation, nausea and vomiting.       + dark stool  All other systems reviewed and are negative.    Physical Exam Updated Vital Signs BP 123/83   Pulse 87   Temp 98.5 F (36.9 C) (Oral)   Resp 11   SpO2 98%   Physical Exam  Constitutional: He is oriented to person, place, and time. He appears well-developed and well-nourished. No distress.  HENT:  Head: Normocephalic and atraumatic.  Neck: Neck supple.  Cardiovascular: Normal rate, regular rhythm and normal heart sounds.   No murmur heard. Pulmonary/Chest: Effort normal and breath sounds normal. No respiratory distress.  Abdominal: Soft. Bowel sounds are normal. He exhibits no distension. There is tenderness (Generalized without focality).  Neurological: He is alert and oriented to person, place, and time.  Skin: Skin is warm and dry.  Nursing note and vitals reviewed.    ED Treatments /  Results  Labs (all labs ordered are listed, but only abnormal results are displayed) Labs Reviewed  COMPREHENSIVE METABOLIC PANEL - Abnormal; Notable for the following:       Result Value   BUN 5 (*)    All other components within normal limits  URINALYSIS, ROUTINE W REFLEX MICROSCOPIC - Abnormal; Notable for the following:    Color, Urine STRAW (*)    Specific Gravity, Urine 1.002 (*)    All other components within normal limits  CBC WITH DIFFERENTIAL/PLATELET  LIPASE, BLOOD  POC OCCULT BLOOD, ED    EKG  EKG  Interpretation None       Radiology No results found.  Procedures Procedures (including critical care time)  Medications Ordered in ED Medications  sodium chloride 0.9 % bolus 1,000 mL (1,000 mLs Intravenous New Bag/Given 07/07/16 1128)     Initial Impression / Assessment and Plan / ED Course  I have reviewed the triage vital signs and the nursing notes.  Pertinent labs & imaging results that were available during my care of the patient were reviewed by me and considered in my medical decision making (see chart for details).    Luke Bowman is a 36 y.o. male who presents to ED for acute worsening of chronic abdominal pain. On exam, patient is afebrile, hemodynamically stable with generalized abdominal tenderness with no focality. Initially was orthostatic by EMS. Given 1 L of IV fluids. Labs reviewed and reassuring. On reevaluation, abdominal exam is very much improved with no peritoneal signs. Evaluation does not show pathology that would require ongoing emergent intervention or inpatient treatment. PCP follow up strongly encouraged. Understands return precautions which were also included in discharge information. All questions answered.    Final Clinical Impressions(s) / ED Diagnoses   Final diagnoses:  Chronic abdominal pain    New Prescriptions New Prescriptions   No medications on file     [facility] Zoltan Genest, PA-C 07/07/16 1203    Azalia Bilis, MD 07/07/16 1626

## 2016-07-07 NOTE — Discharge Instructions (Signed)
Stay hydrated. Follow up with your primary care provider.  I have included the information for Endocrinologist as you requested.   Please seek immediate care if you develop any of the following symptoms: The pain does not go away.  You have a fever.  You keep throwing up and can't keep fluids down. You pass bloody stools.  There is bright red blood in the stool.  There is rectal pain.  You do not seem to be getting better.  You have any questions or concerns.

## 2016-07-07 NOTE — ED Notes (Signed)
ED Provider at bedside. 

## 2016-07-07 NOTE — ED Triage Notes (Signed)
Pt arrives from home via [facility]. Pt states he has been having abdominal pain and dark tarry stools. Pt was positive for orthostatic changes with EMS. Denies n/v/d.

## 2016-07-15 ENCOUNTER — Encounter (HOSPITAL_COMMUNITY): Payer: Self-pay | Admitting: Emergency Medicine

## 2016-07-15 ENCOUNTER — Emergency Department (HOSPITAL_COMMUNITY): Payer: Medicare PPO

## 2016-07-15 ENCOUNTER — Emergency Department (HOSPITAL_COMMUNITY)
Admission: EM | Admit: 2016-07-15 | Discharge: 2016-07-15 | Discharge status: Against Medical Advice | Payer: Medicare PPO | Attending: Emergency Medicine | Admitting: Emergency Medicine

## 2016-07-15 DIAGNOSIS — Z7982 Long term (current) use of aspirin: Secondary | ICD-10-CM | POA: Insufficient documentation

## 2016-07-15 DIAGNOSIS — R1031 Right lower quadrant pain: Secondary | ICD-10-CM | POA: Diagnosis present

## 2016-07-15 DIAGNOSIS — Z955 Presence of coronary angioplasty implant and graft: Secondary | ICD-10-CM | POA: Insufficient documentation

## 2016-07-15 DIAGNOSIS — F17210 Nicotine dependence, cigarettes, uncomplicated: Secondary | ICD-10-CM | POA: Diagnosis not present

## 2016-07-15 DIAGNOSIS — Z8673 Personal history of transient ischemic attack (TIA), and cerebral infarction without residual deficits: Secondary | ICD-10-CM | POA: Insufficient documentation

## 2016-07-15 DIAGNOSIS — Z79899 Other long term (current) drug therapy: Secondary | ICD-10-CM | POA: Insufficient documentation

## 2016-07-15 DIAGNOSIS — I10 Essential (primary) hypertension: Secondary | ICD-10-CM | POA: Insufficient documentation

## 2016-07-15 LAB — COMPREHENSIVE METABOLIC PANEL
ALT: 28 U/L (ref 17–63)
AST: 22 U/L (ref 15–41)
Albumin: 4.2 g/dL (ref 3.5–5.0)
Alkaline Phosphatase: 90 U/L (ref 38–126)
Anion gap: 7 (ref 5–15)
BILIRUBIN TOTAL: 0.6 mg/dL (ref 0.3–1.2)
BUN: 6 mg/dL (ref 6–20)
CO2: 24 mmol/L (ref 22–32)
Calcium: 8.8 mg/dL — ABNORMAL LOW (ref 8.9–10.3)
Chloride: 109 mmol/L (ref 101–111)
Creatinine, Ser: 0.84 mg/dL (ref 0.61–1.24)
Glucose, Bld: 87 mg/dL (ref 65–99)
POTASSIUM: 4.1 mmol/L (ref 3.5–5.1)
Sodium: 140 mmol/L (ref 135–145)
TOTAL PROTEIN: 7.3 g/dL (ref 6.5–8.1)

## 2016-07-15 LAB — URINALYSIS, ROUTINE W REFLEX MICROSCOPIC
BILIRUBIN URINE: NEGATIVE
GLUCOSE, UA: NEGATIVE mg/dL
HGB URINE DIPSTICK: NEGATIVE
KETONES UR: NEGATIVE mg/dL
Leukocytes, UA: NEGATIVE
Nitrite: NEGATIVE
PROTEIN: NEGATIVE mg/dL
Specific Gravity, Urine: 1.005 (ref 1.005–1.030)
pH: 6 (ref 5.0–8.0)

## 2016-07-15 LAB — CBC
HEMATOCRIT: 56.2 % — AB (ref 39.0–52.0)
Hemoglobin: 16.4 g/dL (ref 13.0–17.0)
MCH: 30.9 pg (ref 26.0–34.0)
MCHC: 29.2 g/dL — AB (ref 30.0–36.0)
MCV: 105.8 fL — ABNORMAL HIGH (ref 78.0–100.0)
PLATELETS: 202 10*3/uL (ref 150–400)
RBC: 5.31 MIL/uL (ref 4.22–5.81)
RDW: 13.9 % (ref 11.5–15.5)
WBC: 9 10*3/uL (ref 4.0–10.5)

## 2016-07-15 LAB — LIPASE, BLOOD: LIPASE: 19 U/L (ref 11–51)

## 2016-07-15 NOTE — ED Notes (Signed)
Bed: [id number] Expected date: 07/15/16 Expected time: 11:54 AM Means of arrival: Ambulance Comments: ? Kidney stone

## 2016-07-15 NOTE — ED Notes (Signed)
Pt states he doesn't want to wait much longer for the CT and states he's, "just going to leave."

## 2016-07-15 NOTE — ED Triage Notes (Signed)
Per pt, pt began to have right flank pain and nausea 1 hour ago.  Pt denies vomiting but states he's nauseated and had trouble making a bowel movement today.  Pt was diagnosed with kidney stones in December on the same side.  Pt states it does not feel the same.  Alert and oriented x 4, ambulatory. In no distress at this time.

## 2016-07-15 NOTE — ED Provider Notes (Signed)
[facility]-EMERGENCY DEPT Provider Note   CSN: [id number] Arrival date & time: 07/15/16  1200     History   Chief Complaint Chief Complaint  Patient presents with  . Flank Pain    HPI Luke Bowman is a 36 y.o. male who presents to the Emergency Department with RLQ abdominal pain that radiates around to his bilateral lower back and upwards to his right middle back. He reports the pain began suddenly after he heard a "burst" sound from his abdomen at approximately noon. He describes the pain as severe pins and needles. He reports associated nausea. Denies fever, vomiting, dysuria, fecal or urinary incontinence,  He reports continued chills since he was evaluated in the ED on 5/3. He also complains of tasting fecal matter in his mouth. He reports that his PMH includes hyperthyroidism, nephrolithiasis near his prostate, CVA and TIA.   PCP is Dr. Langston Masker at [facility]. Seen on 3/27. Follow-up appointment on 9/28. Urologist s Dr. Iona Beard seen on 4/25.   The history is provided by the patient. No language interpreter was used.    Past Medical History:  Diagnosis Date  . Hypertension   . Stroke (HCC)   . TIA (transient ischemic attack)     There are no active problems to display for this patient.   Past Surgical History:  Procedure Laterality Date  . CORONARY ANGIOPLASTY WITH STENT PLACEMENT         Home Medications    Prior to Admission medications   Medication Sig Start Date End Date Taking? Authorizing Provider  aspirin EC 81 MG tablet Take 81 mg by mouth daily.    Yes [provider]  cephALEXin (KEFLEX) 500 MG capsule Take 500 mg by mouth 3 (three) times daily. Started 05/03 for 10 days   Yes [provider]  methimazole (TAPAZOLE) 5 MG tablet Take 5 mg by mouth daily.   Yes [provider]  cyclobenzaprine (FLEXERIL) 10 MG tablet Take 1 tablet (10 mg total) by mouth 2 (two) times daily as needed for muscle spasms. Patient not taking:  Reported on 07/15/2016 06/10/16   Donnetta Hutching, MD  dicyclomine (BENTYL) 20 MG tablet Take 1 tablet (20 mg total) by mouth 2 (two) times daily. Patient not taking: Reported on 07/15/2016 06/13/16   Elpidio Anis, PA-C    Family History History reviewed. No pertinent family history.  Social History Social History  Substance Use Topics  . Smoking status: Current Every Day Smoker    Types: Cigarettes  . Smokeless tobacco: Never Used  . Alcohol use No     Allergies   Toradol [ketorolac tromethamine]; Dilaudid [hydromorphone hcl]; Metoprolol; Hydromorphone; Propranolol; and Tramadol   Review of Systems Review of Systems  Constitutional: Positive for chills (chronic). Negative for activity change and fever.  HENT: Negative for congestion and sore throat.   Respiratory: Negative for cough and shortness of breath.   Cardiovascular: Negative for chest pain.  Gastrointestinal: Positive for abdominal pain and nausea. Negative for blood in stool, constipation, diarrhea and vomiting.  Genitourinary: Negative for dysuria and hematuria.  Musculoskeletal: Positive for back pain. Negative for arthralgias.  Skin: Negative for rash.  Allergic/Immunologic: Negative for immunocompromised state.  Neurological: Negative for headaches.  Psychiatric/Behavioral: Negative for confusion.     Physical Exam Updated Vital Signs BP 125/90 (BP Location: Left Arm)   Pulse 76   Temp 98.1 F (36.7 C) (Oral)   Resp 18   Ht 5\' 11"  (1.803 m)   Wt 88.9 kg  SpO2 99%   BMI 27.34 kg/m   Physical Exam  Constitutional: He appears well-developed and well-nourished.  Appears anxious.  HENT:  Head: Normocephalic and atraumatic.  Eyes: Conjunctivae are normal.  Neck: Neck supple.  Cardiovascular: Normal rate and regular rhythm.   No murmur heard. Pulmonary/Chest: Effort normal and breath sounds normal. No respiratory distress. He has no wheezes. He has no rales.  Abdominal: Soft. Bowel sounds are normal. He  exhibits no distension and no mass. There is tenderness. There is no guarding. No hernia.  Distractable tenderness. Pain out of proportion to exam; however when firmly auscultating with stethoscope, patient had no tenderness.   Musculoskeletal: Normal range of motion. He exhibits no edema, tenderness or deformity.       Cervical back: Normal.       Thoracic back: Normal.       Lumbar back: Normal. He exhibits no tenderness and no bony tenderness.  Neurological: He is alert.  Skin: Skin is warm and dry.  Psychiatric: His behavior is normal.  Nursing note and vitals reviewed.  ED Treatments / Results  Labs (all labs ordered are listed, but only abnormal results are displayed) Labs Reviewed  CBC - Abnormal; Notable for the following:       Result Value   HCT 56.2 (*)    MCV 105.8 (*)    MCHC 29.2 (*)    All other components within normal limits  COMPREHENSIVE METABOLIC PANEL - Abnormal; Notable for the following:    Calcium 8.8 (*)    All other components within normal limits  URINALYSIS, ROUTINE W REFLEX MICROSCOPIC  LIPASE, BLOOD    EKG  EKG Interpretation None       Radiology No results found.  Procedures Procedures (including critical care time)  Medications Ordered in ED Medications - No data to display   Initial Impression / Assessment and Plan / ED Course  I have reviewed the triage vital signs and the nursing notes.  Pertinent labs & imaging results that were available during my care of the patient were reviewed by me and considered in my medical decision making (see chart for details).    36 y.o. male, anxious-appearing, with RLQ abdominal pain that began immediately after hearing a "bursting" sound from his abdomen. NAD. VSS. Distractable tenderness noted on abdominal and back exam. Patient is talking to his mother and looking up his medical records on his phone. UA unremarkable. CBC not concerning for infection. No electrolyte abnormalities noted on CMP.  Lipase WNL. CT A/P ordered to further assess RLQ tenderness.  Notified by nursing staff that patient is requesting to leave AMA because he does not want to wait any longer to have the CT perform and would like to follow up with primary care. I have discussed my concerns as a provider and the possibility that this may worsen. We discussed the nature, risks and benefits, and alternatives to treatment. I have specifically discussed that without further evaluation I cannot guarantee there is not a life threatening event occuring.  Time was given to allow the opportunity to ask questions and consider the options, and after the discussion, the patient decided to refuse the offered treatment. Pt is A&Ox4, his own POA and states understanding of my concerns and the possible consequences. After refusal, I made every reasonable opportunity to treat them to the best of my ability. I have made the patient aware that this is an AMA discharge, but he may return at any time for further evaluation  and treatment.  Final Clinical Impressions(s) / ED Diagnoses   Final diagnoses:  Right lower quadrant abdominal pain    New Prescriptions Discharge Medication List as of 07/15/2016  3:19 PM       Frederik PearMcDonald, Solana Coggin A, PA-C 07/16/16 [id number]    Arby BarrettePfeiffer, Marcy, MD 07/23/16 [id number]

## 2016-07-17 ENCOUNTER — Encounter (HOSPITAL_COMMUNITY): Payer: Self-pay | Admitting: Emergency Medicine

## 2016-07-17 ENCOUNTER — Emergency Department (HOSPITAL_COMMUNITY)
Admission: EM | Admit: 2016-07-17 | Discharge: 2016-07-17 | Disposition: A | Discharge status: Home / Self Care | Payer: Medicare PPO | Attending: Emergency Medicine | Admitting: Emergency Medicine

## 2016-07-17 ENCOUNTER — Emergency Department (HOSPITAL_COMMUNITY): Payer: Medicare PPO

## 2016-07-17 DIAGNOSIS — Z8673 Personal history of transient ischemic attack (TIA), and cerebral infarction without residual deficits: Secondary | ICD-10-CM | POA: Insufficient documentation

## 2016-07-17 DIAGNOSIS — I10 Essential (primary) hypertension: Secondary | ICD-10-CM | POA: Diagnosis not present

## 2016-07-17 DIAGNOSIS — F17210 Nicotine dependence, cigarettes, uncomplicated: Secondary | ICD-10-CM | POA: Diagnosis not present

## 2016-07-17 DIAGNOSIS — Z7982 Long term (current) use of aspirin: Secondary | ICD-10-CM | POA: Diagnosis not present

## 2016-07-17 DIAGNOSIS — Z79899 Other long term (current) drug therapy: Secondary | ICD-10-CM | POA: Insufficient documentation

## 2016-07-17 DIAGNOSIS — Z955 Presence of coronary angioplasty implant and graft: Secondary | ICD-10-CM | POA: Diagnosis not present

## 2016-07-17 DIAGNOSIS — R1031 Right lower quadrant pain: Secondary | ICD-10-CM | POA: Diagnosis present

## 2016-07-17 LAB — COMPREHENSIVE METABOLIC PANEL
ALBUMIN: 4 g/dL (ref 3.5–5.0)
ALK PHOS: 92 U/L (ref 38–126)
ALT: 26 U/L (ref 17–63)
ANION GAP: 8 (ref 5–15)
AST: 23 U/L (ref 15–41)
BUN: 6 mg/dL (ref 6–20)
CO2: 22 mmol/L (ref 22–32)
Calcium: 9.1 mg/dL (ref 8.9–10.3)
Chloride: 105 mmol/L (ref 101–111)
Creatinine, Ser: 0.83 mg/dL (ref 0.61–1.24)
GFR calc Af Amer: >60 mL/min (ref >60)
GFR calc non Af Amer: >60 mL/min (ref >60)
GLUCOSE: 111 mg/dL — AB (ref 65–99)
POTASSIUM: 4 mmol/L (ref 3.5–5.1)
SODIUM: 135 mmol/L (ref 135–145)
Total Bilirubin: 0.5 mg/dL (ref 0.3–1.2)
Total Protein: 6.8 g/dL (ref 6.5–8.1)

## 2016-07-17 LAB — CBC
HEMATOCRIT: 46.2 % (ref 39.0–52.0)
HEMOGLOBIN: 16 g/dL (ref 13.0–17.0)
MCH: 30 pg (ref 26.0–34.0)
MCHC: 34.6 g/dL (ref 30.0–36.0)
MCV: 86.5 fL (ref 78.0–100.0)
Platelets: 254 10*3/uL (ref 150–400)
RBC: 5.34 MIL/uL (ref 4.22–5.81)
RDW: 12.8 % (ref 11.5–15.5)
WBC: 7.2 10*3/uL (ref 4.0–10.5)

## 2016-07-17 LAB — URINALYSIS, ROUTINE W REFLEX MICROSCOPIC
BACTERIA UA: NONE SEEN
BILIRUBIN URINE: NEGATIVE
GLUCOSE, UA: NEGATIVE mg/dL
KETONES UR: NEGATIVE mg/dL
LEUKOCYTES UA: NEGATIVE
Nitrite: NEGATIVE
PH: 6 (ref 5.0–8.0)
Protein, ur: NEGATIVE mg/dL
SQUAMOUS EPITHELIAL / LPF: NONE SEEN
Specific Gravity, Urine: 1.003 — ABNORMAL LOW (ref 1.005–1.030)
WBC, UA: NONE SEEN WBC/hpf (ref 0–5)

## 2016-07-17 LAB — LIPASE, BLOOD: Lipase: 27 U/L (ref 11–51)

## 2016-07-17 MED ORDER — IOPAMIDOL (ISOVUE-300) INJECTION 61%
INTRAVENOUS | Status: AC
  Filled 2016-07-17: qty 100

## 2016-07-17 MED ORDER — SODIUM CHLORIDE 0.9 % IV BOLUS (SEPSIS)
1000.0000 mL | Freq: Once | INTRAVENOUS | Status: AC
  Administered 2016-07-17: 1000 mL via INTRAVENOUS

## 2016-07-17 MED ORDER — METHIMAZOLE 5 MG PO TABS: 5.0000 mg | ORAL_TABLET | Freq: Every day | ORAL | 0 refills | Status: AC

## 2016-07-17 NOTE — ED Triage Notes (Signed)
Pt c/o right sided abdominal pain that radiates across his abdomen and  around to his back onset 2-3 days. Pt seen here for same recently but left due to long wait for CT. Pt also reports that he is out of his medication for his thyroid.

## 2016-07-17 NOTE — ED Provider Notes (Signed)
[facility]-EMERGENCY DEPT Provider Note   CSN: [id number] Arrival date & time: 07/17/16  1315     History   Chief Complaint Chief Complaint  Patient presents with  . Abdominal Pain  . Nausea  . Diarrhea    HPI Luke Bowman is a 36 y.o. male.  HPI  36 y.o. male with a hx of HTN, presents to the Emergency Department today complaining of right lower abdominal pain that radiates across abdomen x 2-3 days. Seen at [facility] for same on 07-15-16, but left AMA due to wait time for CT scanner. Notes pain as a sharp sensation that is intermittent. Rates pain 6/10 currently. States that at one point he felt a "burst" sensation from his abdomen and noted severe pain that felt like pins and needles. No meds PTA. No N/V. Notes intermittent diarrhea. No hematochezia or melena. No CP/SOB. Wanted to see his PCP for this, but decided he could not wait and came to the ED. No other symptoms noted.   Past Medical History:  Diagnosis Date  . Hypertension   . Stroke (HCC)   . TIA (transient ischemic attack)     There are no active problems to display for this patient.   Past Surgical History:  Procedure Laterality Date  . CORONARY ANGIOPLASTY WITH STENT PLACEMENT         Home Medications    Prior to Admission medications   Medication Sig Start Date End Date Taking? Authorizing Provider  aspirin EC 81 MG tablet Take 81 mg by mouth daily.     [provider]  cephALEXin (KEFLEX) 500 MG capsule Take 500 mg by mouth 3 (three) times daily. Started 05/03 for 10 days    [provider]  cyclobenzaprine (FLEXERIL) 10 MG tablet Take 1 tablet (10 mg total) by mouth 2 (two) times daily as needed for muscle spasms. Patient not taking: Reported on 07/15/2016 06/10/16   Donnetta Hutching, MD  dicyclomine (BENTYL) 20 MG tablet Take 1 tablet (20 mg total) by mouth 2 (two) times daily. Patient not taking: Reported on 07/15/2016 06/13/16   Elpidio Anis, PA-C  methimazole (TAPAZOLE) 5 MG tablet Take 5 mg by  mouth daily.    [provider]    Family History No family history on file.  Social History Social History  Substance Use Topics  . Smoking status: Current Every Day Smoker    Types: Cigarettes  . Smokeless tobacco: Never Used  . Alcohol use No     Allergies   Toradol [ketorolac tromethamine]; Dilaudid [hydromorphone hcl]; Metoprolol; Hydromorphone; Propranolol; and Tramadol   Review of Systems Review of Systems ROS reviewed and all are negative for acute change except as noted in the HPI.  Physical Exam Updated Vital Signs BP 127/88 (BP Location: Left Arm)   Pulse 94   Temp 98.8 F (37.1 C) (Oral)   Resp 18   Ht 5\' 11"  (1.803 m)   Wt 86.2 kg   SpO2 98%   BMI 26.50 kg/m   Physical Exam  Constitutional: He is oriented to person, place, and time. Vital signs are normal. He appears well-developed and well-nourished.  NAD. Resting Comfortably   HENT:  Head: Normocephalic and atraumatic.  Right Ear: Hearing normal.  Left Ear: Hearing normal.  Eyes: Conjunctivae and EOM are normal. Pupils are equal, round, and reactive to light.  Neck: Normal range of motion. Neck supple.  Cardiovascular: Normal rate, regular rhythm, normal heart sounds and intact distal pulses.   Pulmonary/Chest: Effort normal and  breath sounds normal.  Abdominal: Soft. Normal appearance and bowel sounds are normal. There is tenderness in the right lower quadrant, periumbilical area and suprapubic area. There is no rigidity, no rebound, no guarding, no CVA tenderness, no tenderness at McBurney's point and negative Murphy's sign.  Musculoskeletal: Normal range of motion.  Neurological: He is alert and oriented to person, place, and time.  Skin: Skin is warm and dry.  Psychiatric: He has a normal mood and affect. His speech is normal and behavior is normal. Thought content normal.  Nursing note and vitals reviewed.  ED Treatments / Results  Labs (all labs ordered are listed, but only  abnormal results are displayed) Labs Reviewed  COMPREHENSIVE METABOLIC PANEL - Abnormal; Notable for the following:       Result Value   Glucose, Bld 111 (*)    All other components within normal limits  LIPASE, BLOOD  CBC  URINALYSIS, ROUTINE W REFLEX MICROSCOPIC    EKG  EKG Interpretation None       Radiology No results found.  Procedures Procedures (including critical care time)  Medications Ordered in ED Medications - No data to display   Initial Impression / Assessment and Plan / ED Course  I have reviewed the triage vital signs and the nursing notes.  Pertinent labs & imaging results that were available during my care of the patient were reviewed by me and considered in my medical decision making (see chart for details).  Final Clinical Impressions(s) / ED Diagnoses  {I have reviewed and evaluated the relevant laboratory values.   {I have reviewed the relevant previous healthcare records.  {I obtained HPI from historian.   ED Course:  Assessment: Pt is a 36 y.o. male with hx Hypothyroidism who presents to the Emergency Department today complaining of right lower abdominal pain that radiates across abdomen x 2-3 days. [facility], but left AMA due to wait time for CT scanner. Notes pain as a sharp sensation that is intermittent. Rates pain 6/10 currently. States that at one point he felt a "burst" sensation from his abdomen and noted severe pain that felt like pins and needles. On exam, pt in NAD. Nontoxic/nonseptic appearing. VSS. Afebrile. Lungs CTA. Heart RRR. Abdomen TTP RLQ. Labs unremarkable. Given NS bolus in ED. Pending CT abdomen/pelvis. Likely bowel gas vs enteritis, but pt extremely tender on palpation of RLQ. Will obtain CT. If negative, DC home with follow up to PCP. Pt also requested refill of his thyroid medication (Methimazole). Will provide x1 month supply. Encouraged PCP or endocrinology follow up.   Disposition/Plan:  Pending CT  Abdomen/Pelvis 3:48 PM- Sign out to Shanna CiscoJamie Ward, PA-C Pt acknowledges and agrees with plan  Supervising Physician Lavera GuiseLiu, Dana Duo, MD  Final diagnoses:  Right lower quadrant abdominal pain    New Prescriptions New Prescriptions   No medications on file     Audry PiliMohr, Amerie Beaumont, Cordelia Poche-C 07/17/16 1548    Lavera GuiseLiu, Dana Duo, MD 07/17/16 1743

## 2016-07-17 NOTE — ED Provider Notes (Signed)
Care [facility]. Please see note for further details. Case discussed, plan agreed upon. Briefly, patient is a 36 y.o. male who presents to ER for abdominal pain. Labs reassuring. Afebrile and VSS. CT abdomen/pelvis pending. If negative, dc to home with PCP follow up.   CT with no acute findings. No findings for appendicitis. Discussed reassuring CT with patient. Follow up with PCP. Understands to return if new or worsening symptoms develop. All questions answered.   Results for orders placed or performed during the hospital encounter of 07/17/16  Lipase, blood  Result Value Ref Range   Lipase 27 11 - 51 U/L  Comprehensive metabolic panel  Result Value Ref Range   Sodium 135 135 - 145 mmol/L   Potassium 4.0 3.5 - 5.1 mmol/L   Chloride 105 101 - 111 mmol/L   CO2 22 22 - 32 mmol/L   Glucose, Bld 111 (H) 65 - 99 mg/dL   BUN 6 6 - 20 mg/dL   Creatinine, Ser 0.860.83 0.61 - 1.24 mg/dL   Calcium 9.1 8.9 - 57.810.3 mg/dL   Total Protein 6.8 6.5 - 8.1 g/dL   Albumin 4.0 3.5 - 5.0 g/dL   AST 23 15 - 41 U/L   ALT 26 17 - 63 U/L   Alkaline Phosphatase 92 38 - 126 U/L   Total Bilirubin 0.5 0.3 - 1.2 mg/dL   GFR calc non Af Amer >60 >60 mL/min   GFR calc Af Amer >60 >60 mL/min   Anion gap 8 5 - 15  CBC  Result Value Ref Range   WBC 7.2 4.0 - 10.5 K/uL   RBC 5.34 4.22 - 5.81 MIL/uL   Hemoglobin 16.0 13.0 - 17.0 g/dL   HCT 46.946.2 62.939.0 - 52.852.0 %   MCV 86.5 78.0 - 100.0 fL   MCH 30.0 26.0 - 34.0 pg   MCHC 34.6 30.0 - 36.0 g/dL   RDW 41.312.8 24.411.5 - 01.015.5 %   Platelets 254 150 - 400 K/uL  Urinalysis, Routine w reflex microscopic  Result Value Ref Range   Color, Urine STRAW (A) YELLOW   APPearance CLEAR CLEAR   Specific Gravity, Urine 1.003 (L) 1.005 - 1.030   pH 6.0 5.0 - 8.0   Glucose, UA NEGATIVE NEGATIVE mg/dL   Hgb urine dipstick SMALL (A) NEGATIVE   Bilirubin Urine NEGATIVE NEGATIVE   Ketones, ur NEGATIVE NEGATIVE mg/dL   Protein, ur NEGATIVE NEGATIVE mg/dL   Nitrite  NEGATIVE NEGATIVE   Leukocytes, UA NEGATIVE NEGATIVE   RBC / HPF 0-5 0 - 5 RBC/hpf   WBC, UA NONE SEEN 0 - 5 WBC/hpf   Bacteria, UA NONE SEEN NONE SEEN   Squamous Epithelial / LPF NONE SEEN NONE SEEN      Mack Alvidrez, Chase PicketJaime Pilcher, PA-C 07/17/16 1841    Bethann BerkshireZammit, Joseph, MD 07/18/16 [phone]

## 2016-07-17 NOTE — ED Notes (Signed)
Patient transported to CT 

## 2016-07-17 NOTE — Discharge Instructions (Signed)
Please read and follow all provided instructions.  Your diagnoses today include:  1. Right lower quadrant abdominal pain    Tests performed today include: Vital signs. See below for your results today.   Medications prescribed:  Take as prescribed   Home care instructions:  Follow any educational materials contained in this packet.  Follow-up instructions: Please follow-up with your primary care provider for further evaluation of symptoms and treatment   Return instructions:  Please return to the Emergency Department if you do not get better, if you get worse, or new symptoms OR  - Fever (temperature greater than 101.93F)  - Bleeding that does not stop with holding pressure to the area    -Severe pain (please note that you may be more sore the day after your accident)  - Chest Pain  - Difficulty breathing  - Severe nausea or vomiting  - Inability to tolerate food and liquids  - Passing out  - Skin becoming red around your wounds  - Change in mental status (confusion or lethargy)  - New numbness or weakness    Please return if you have any other emergent concerns.  Additional Information:  Your vital signs today were: BP 121/89    Pulse 91    Temp 98.8 F (37.1 C) (Oral)    Resp 10    Ht 5\' 11"  (1.803 m)    Wt 86.2 kg    SpO2 97%    BMI 26.50 kg/m  If your blood pressure (BP) was elevated above 135/85 this visit, please have this repeated by your doctor within one month. ---------------

## 2016-07-21 ENCOUNTER — Telehealth: Payer: Self-pay | Admitting: *Deleted

## 2016-07-21 NOTE — Telephone Encounter (Signed)
Pt did not receive Rx paper.  EDCM [facility] [address]

## 2018-03-23 IMAGING — CR DG CHEST 2V
2 series · 2 of 2 positions shown · non-contrast
Comparison: Abdominal CT 05/06/2016.

CLINICAL DATA: 35-year-old with headache and tingling in the arms
and legs. Patient reports remote history of strokes.

EXAM:
CHEST  2 VIEW

[chest pa]
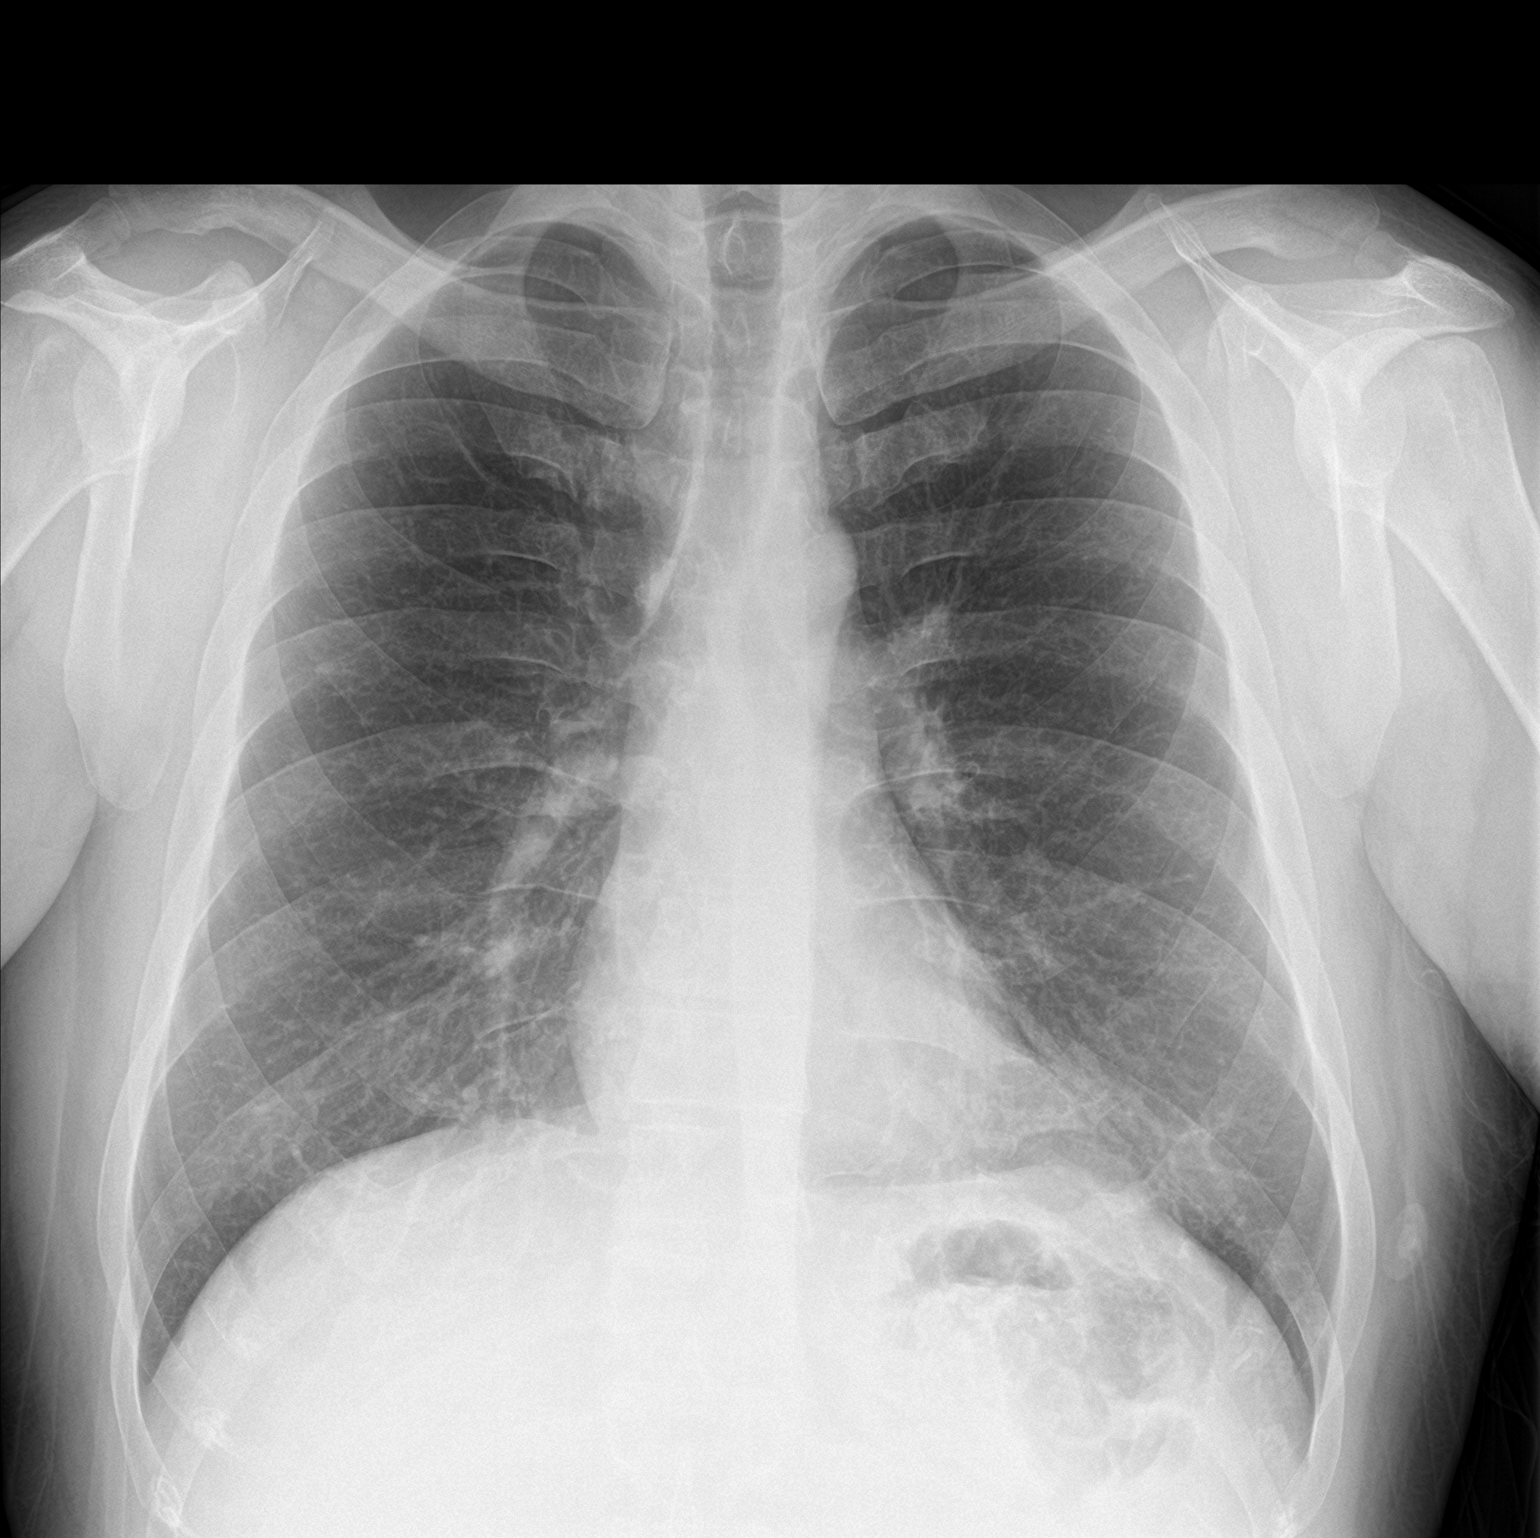

[chest lat]
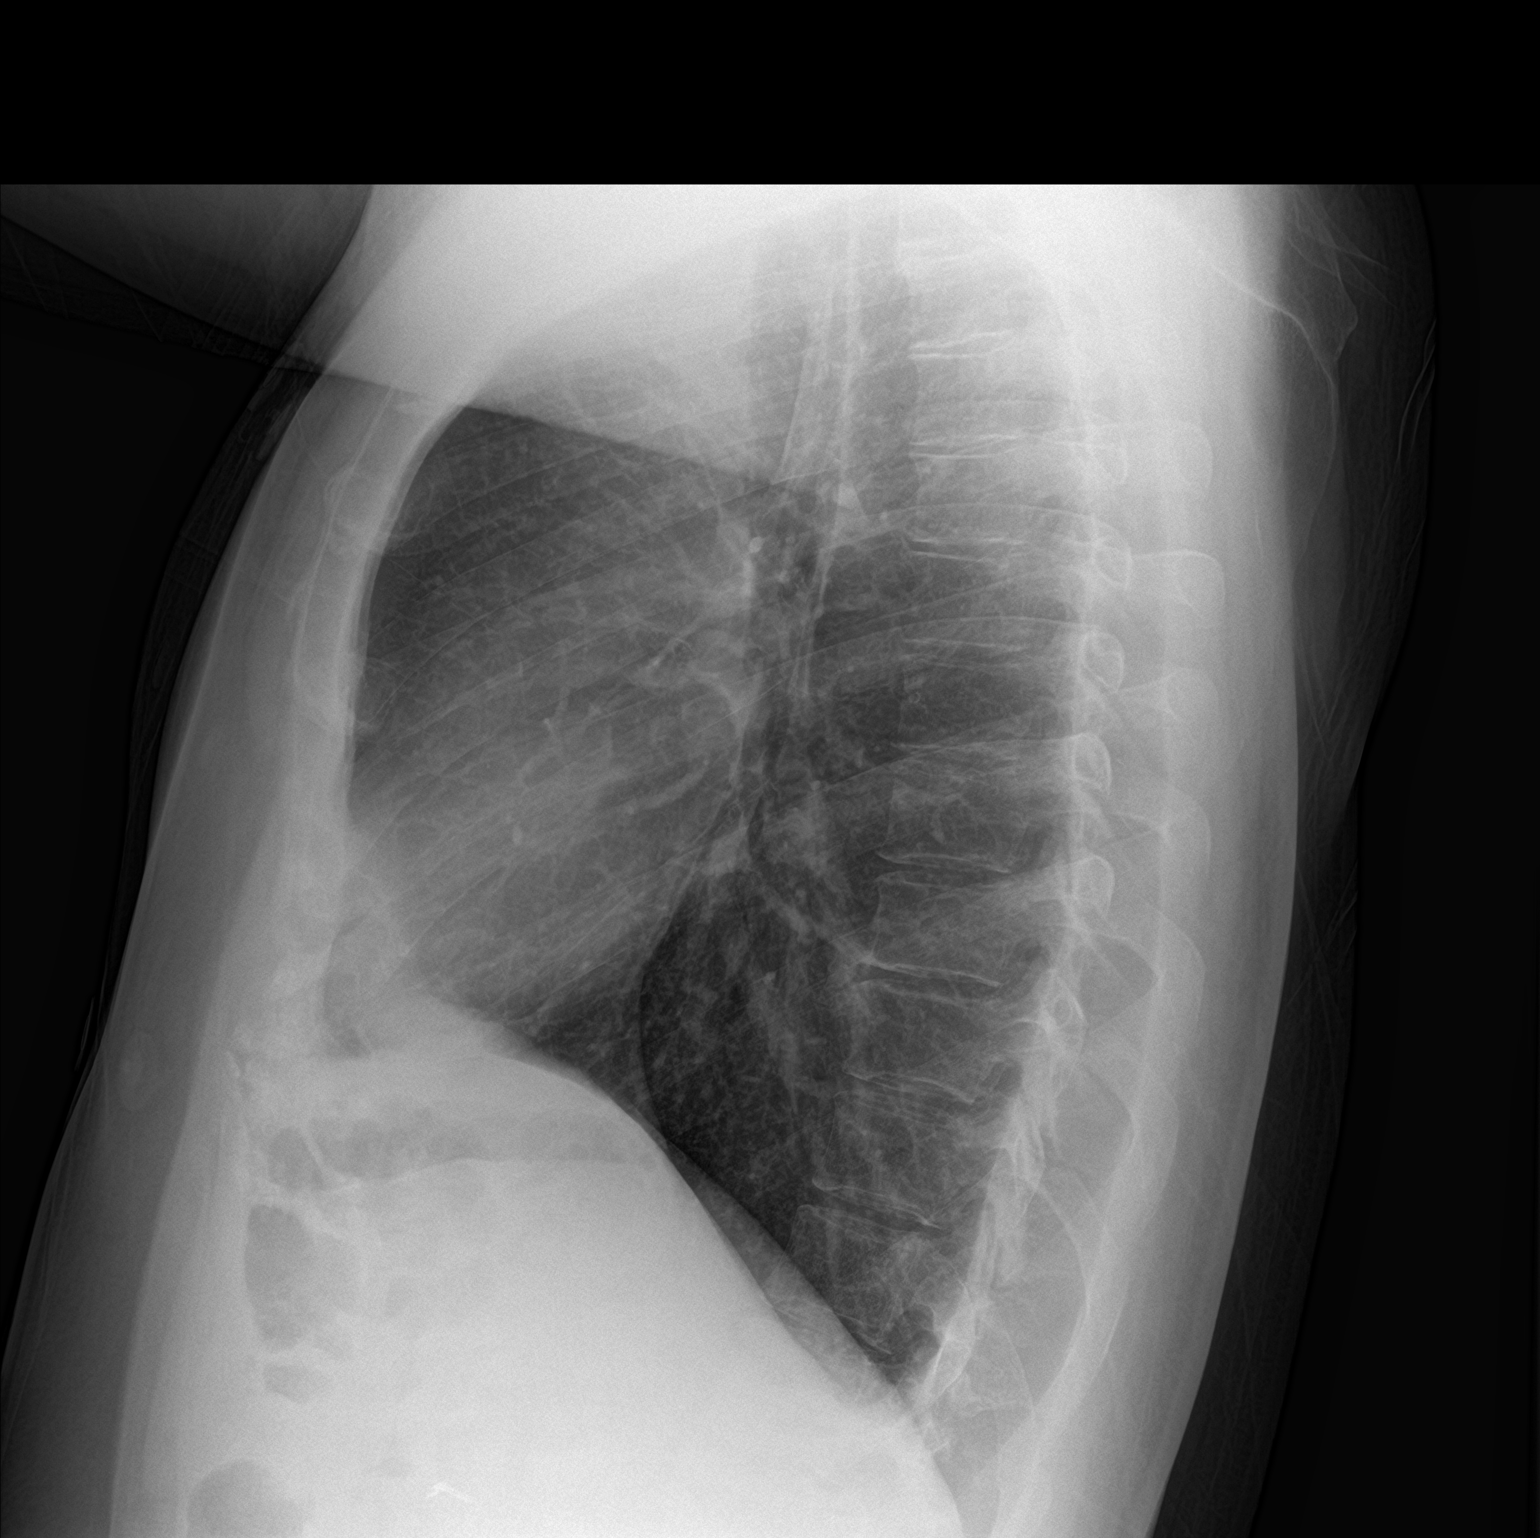

[2 of 2 positions shown; findings below may reference images not displayed]

FINDINGS: The heart size and mediastinal contours are normal. Increased
density at the left cardiophrenic angle corresponds with epicardial
fat on recent abdominal CT. The lungs are clear. There is no pleural
effusion or pneumothorax. No acute osseous findings are seen.
IMPRESSION: No active cardiopulmonary process.

## 2018-03-25 IMAGING — CR DG CHEST 2V
2 series · 2 of 2 positions shown · non-contrast
Comparison: 05/27/2016

CLINICAL DATA: Chest pain, shortness of breath

EXAM:
CHEST  2 VIEW

[chest pa]
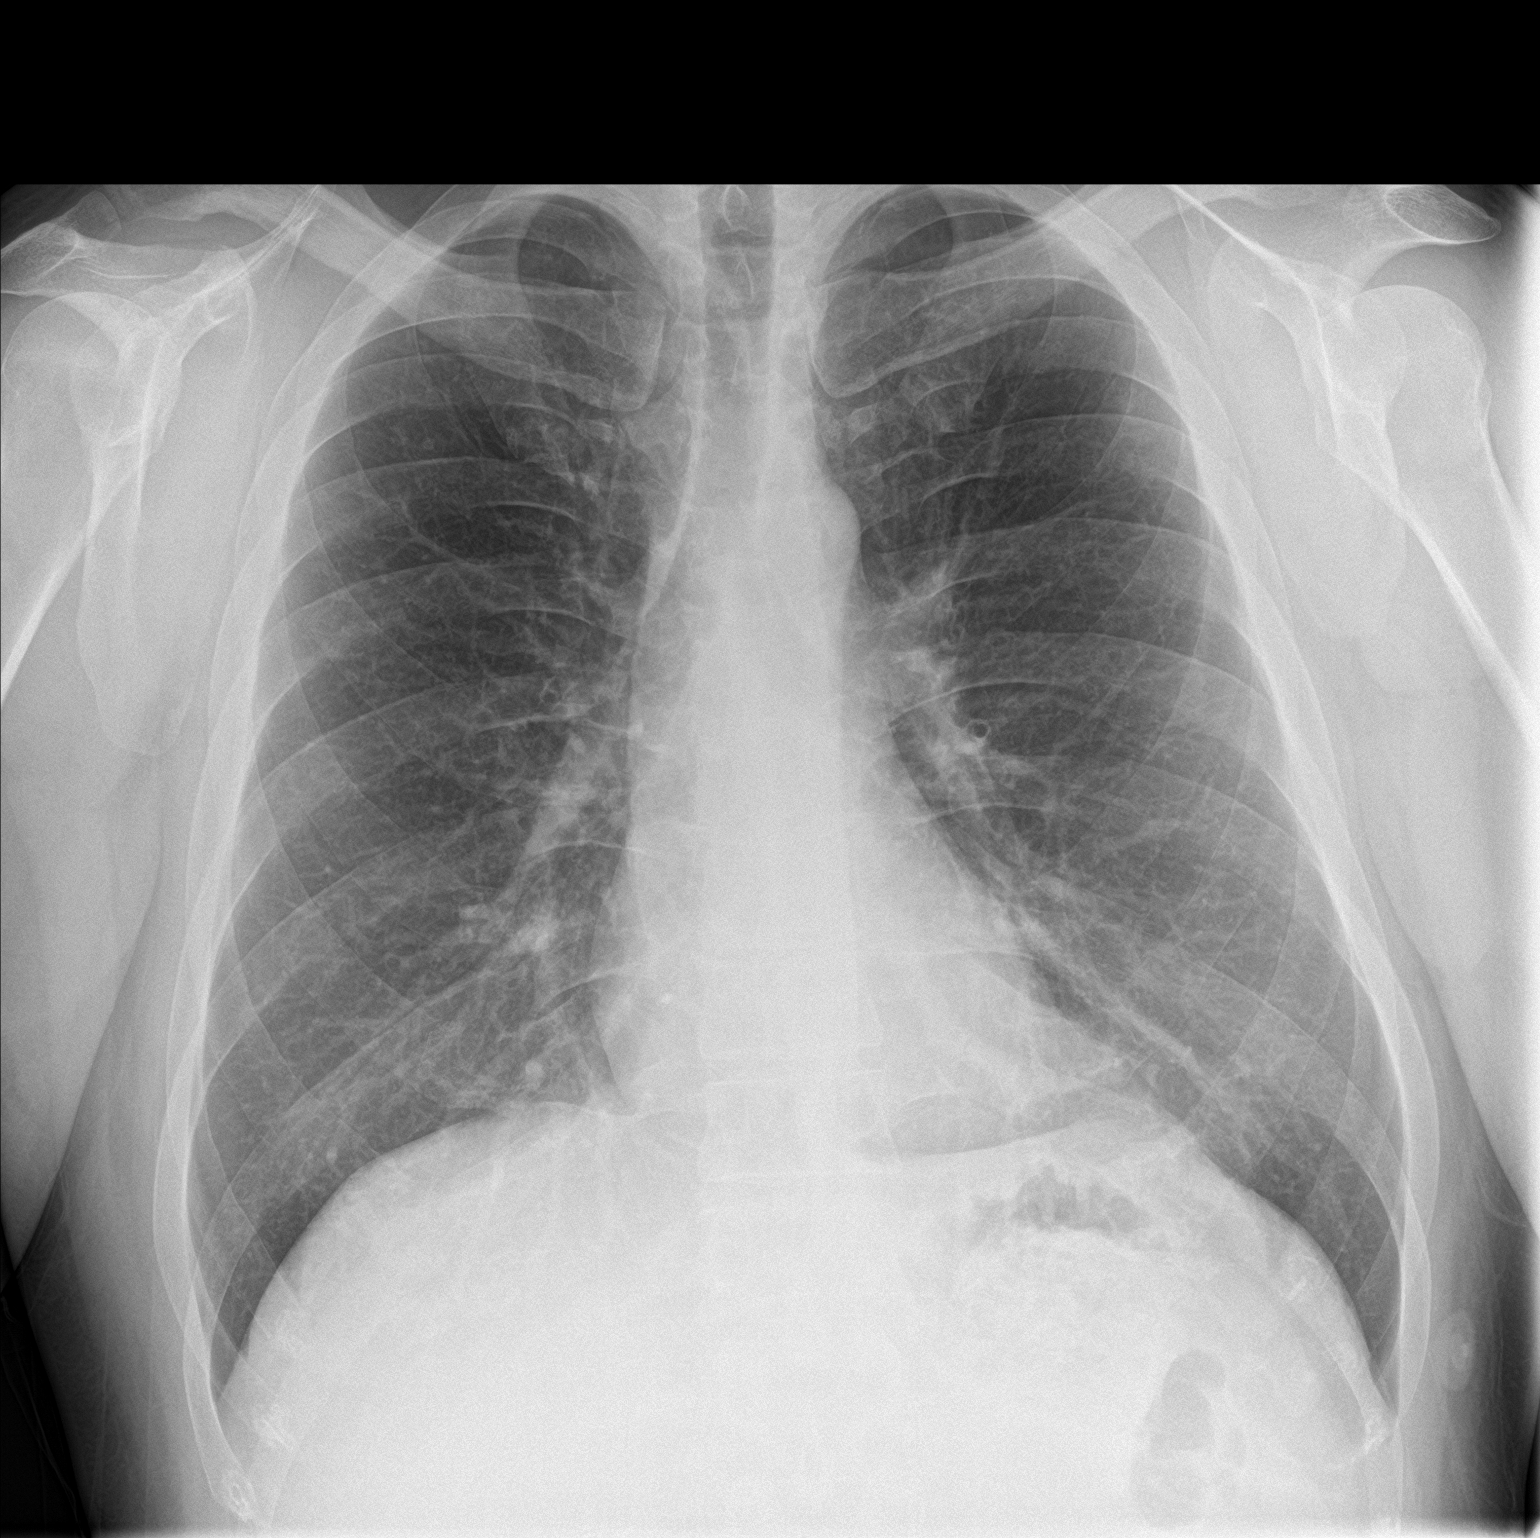

[chest lat]
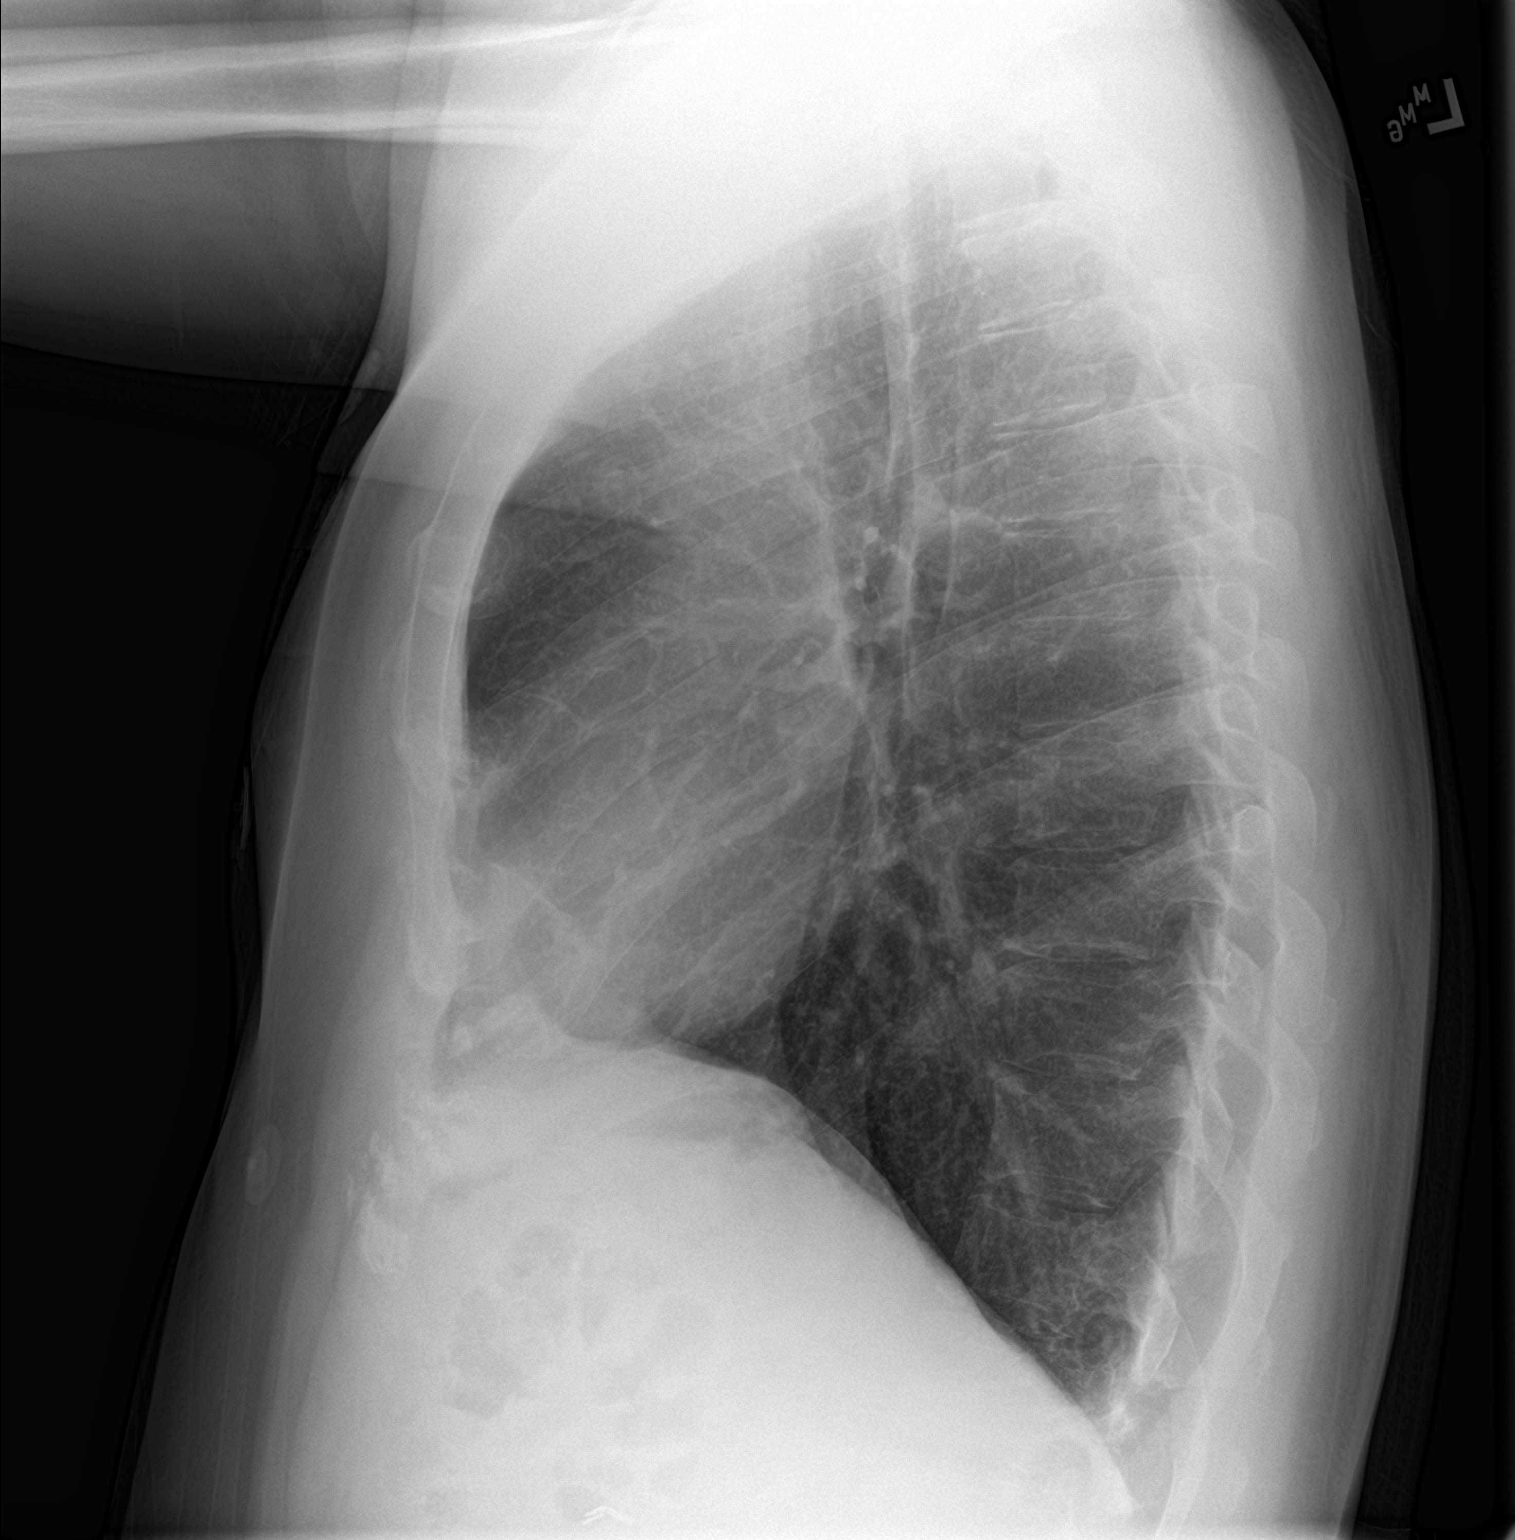

[2 of 2 positions shown; findings below may reference images not displayed]

FINDINGS: Cardiomediastinal silhouette is stable. No infiltrate or pleural
effusion. No pulmonary edema. Bony thorax is unremarkable.
IMPRESSION: No active cardiopulmonary disease.

## 2018-03-25 IMAGING — US US ABDOMEN COMPLETE
1 series · 14 of 25 positions shown · non-contrast
Comparison: Prior CT from 05/06/2016.

CLINICAL DATA: Initial evaluation for acute right-sided abdominal
pain.

EXAM:
ABDOMEN ULTRASOUND COMPLETE

[Series 1: us abdomen complete · 0.23mm/px · 14 of 73 slices shown]
[im 1/73]
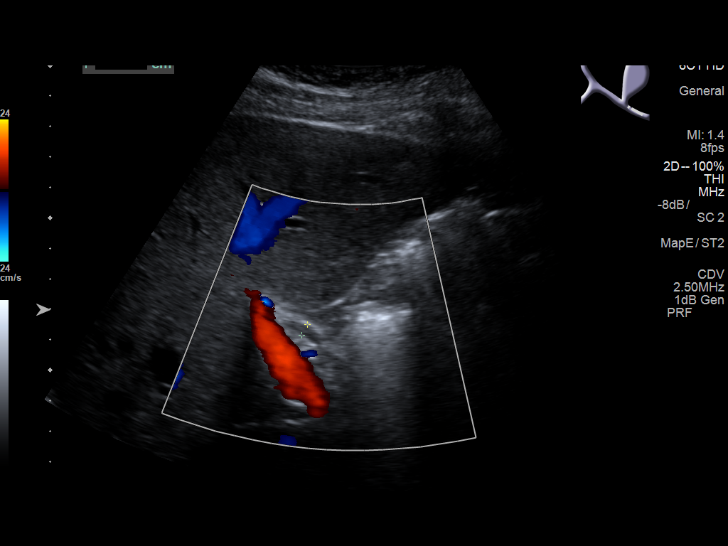
[im 7/73]
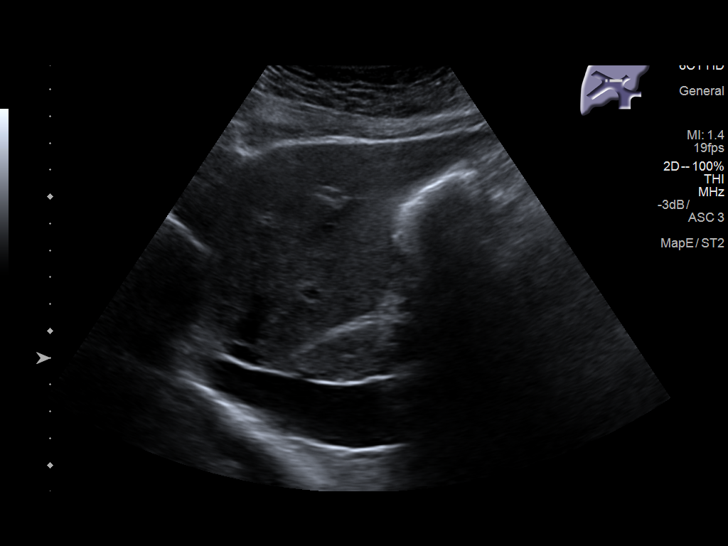
[im 13/73]
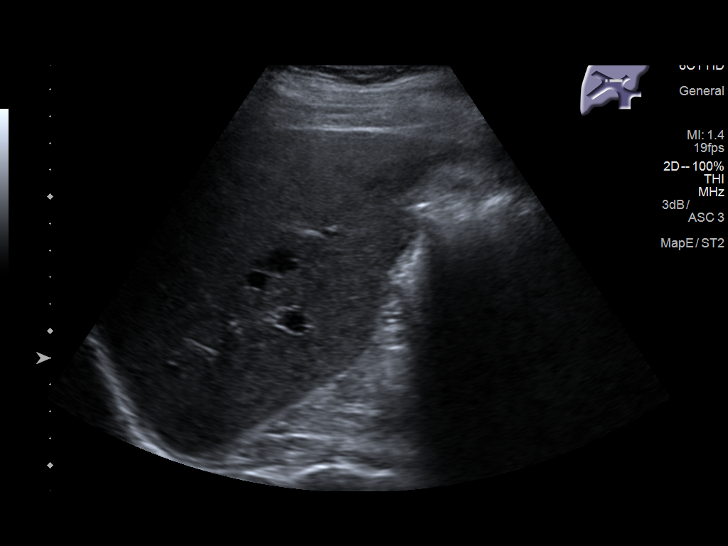
[im 19/73]
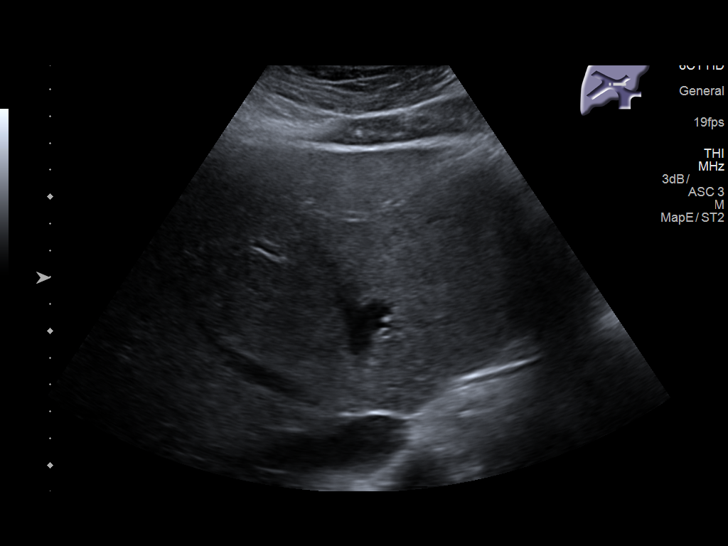
[im 25/73]
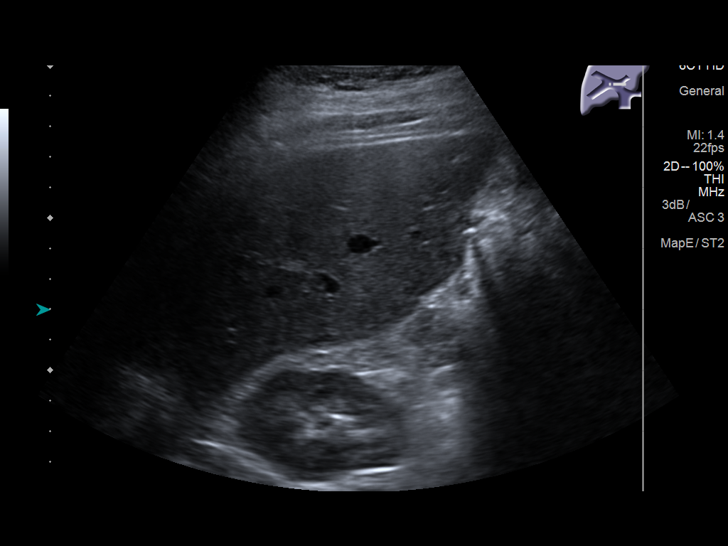
[im 28/73]
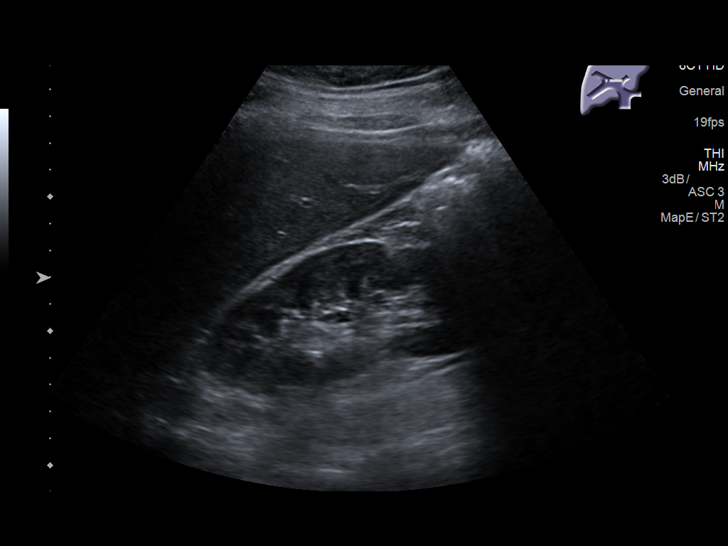
[im 34/73]
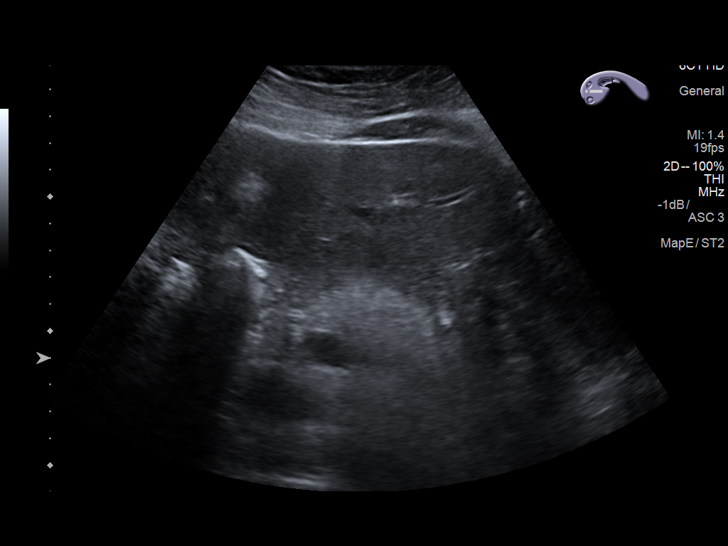
[im 40/73]
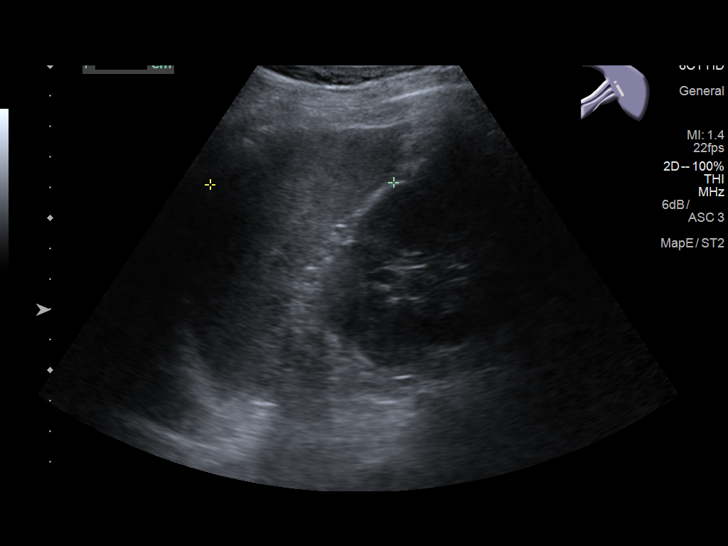
[im 46/73]
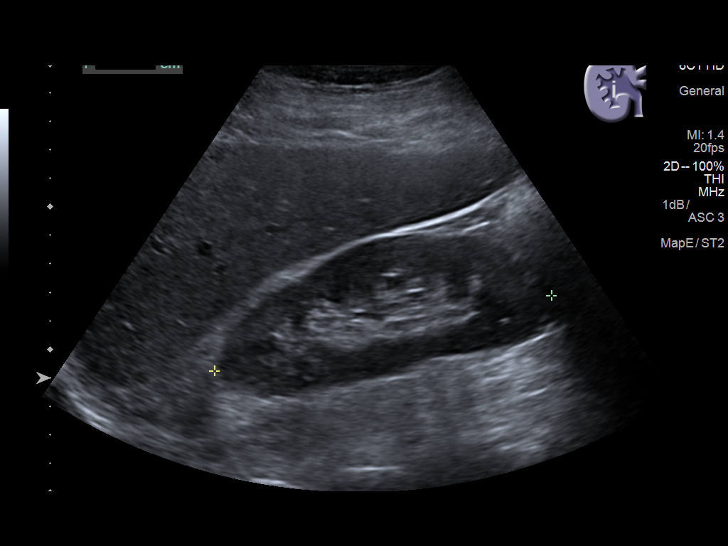
[im 49/73]
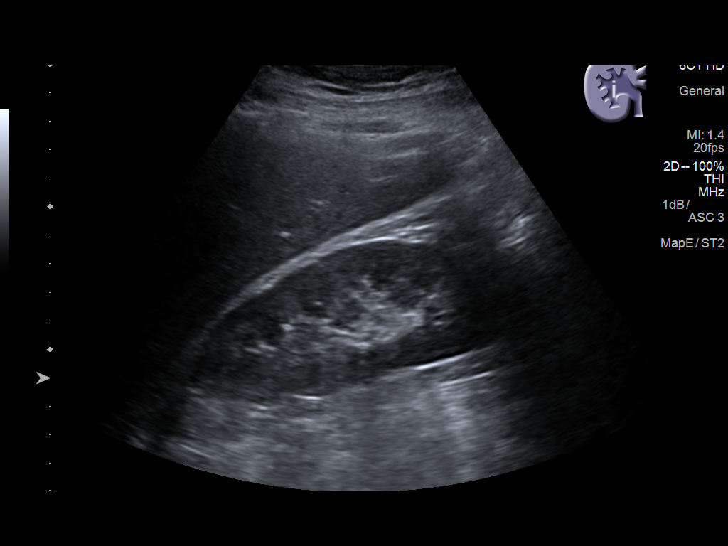
[im 55/73]
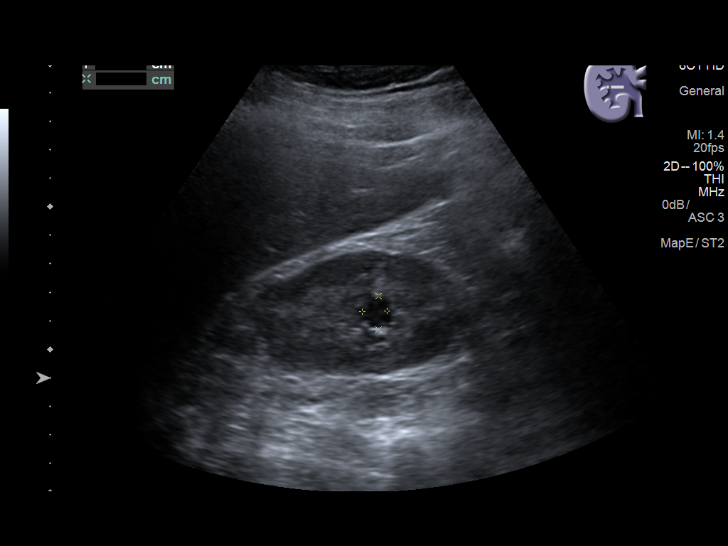
[im 61/73]
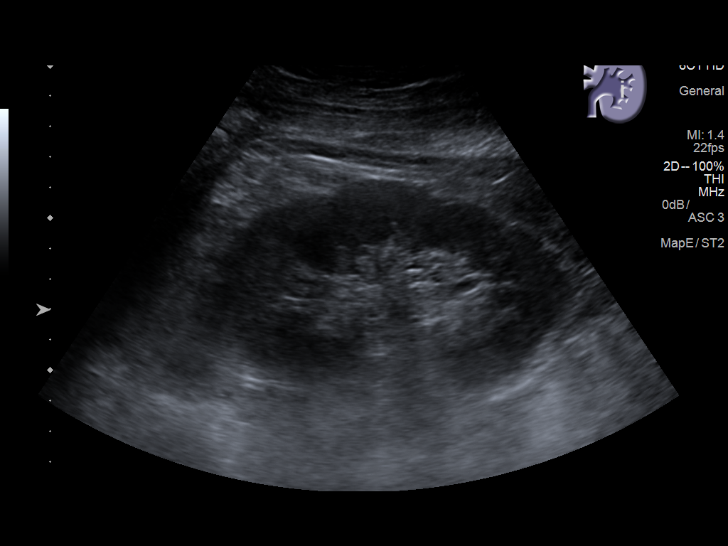
[im 67/73]
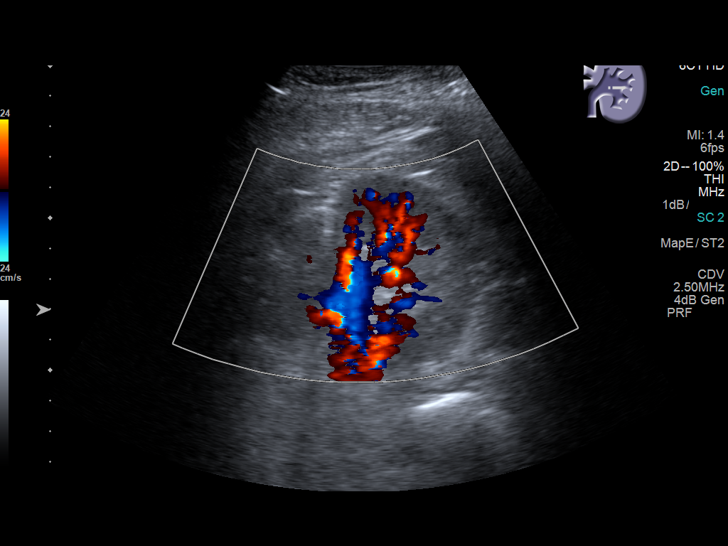
[im 73/73]
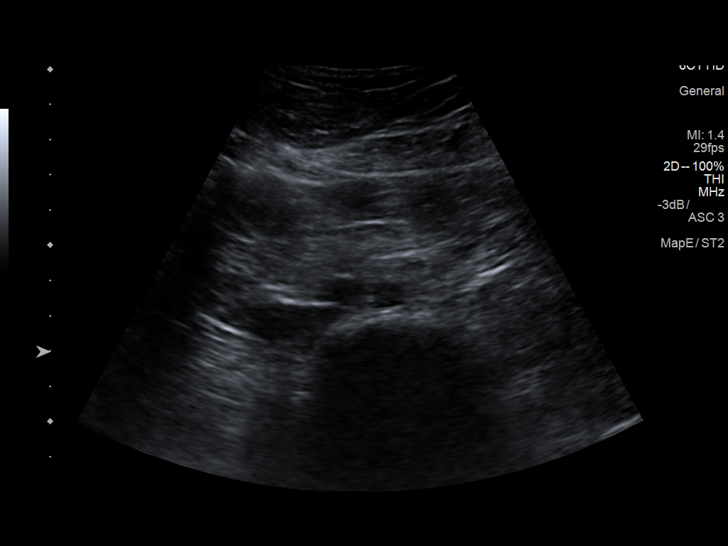

[14 of 25 positions shown; findings below may reference images not displayed]

FINDINGS: Gallbladder: Surgically absent.

Common bile duct: Diameter: 4 mm

Liver: No focal lesion identified. Within normal limits in
parenchymal echogenicity.

IVC: No abnormality visualized.

Pancreas: Visualized portion unremarkable.

Spleen: Size and appearance within normal limits.

Right Kidney: Length: 12.1 cm. Echogenicity within normal limits. No
solid mass or hydronephrosis visualized. 0.9 x 1.2 x 1.1 cm cyst
noted.

Left Kidney: Length: 11.5 cm. Echogenicity within normal limits. No
mass or hydronephrosis visualized.

Abdominal aorta: No aneurysm visualized.

Other findings: None.
IMPRESSION: 1. Negative abdominal ultrasound.  No acute abnormality identified.
2. Status post cholecystectomy.
3. 1.1 cm right renal cyst.
